# Patient Record
Sex: Male | Born: 1960
Health system: Southern US, Community
[De-identification: ages and names within clinical notes are randomized; demographics above are authoritative.]

## PROBLEM LIST (undated history)

## (undated) ENCOUNTER — Emergency Department (HOSPITAL_COMMUNITY): Admission: EM | Payer: BLUE CROSS/BLUE SHIELD | Source: Home / Self Care

## (undated) DIAGNOSIS — I5031 Acute diastolic (congestive) heart failure: Secondary | ICD-10-CM

## (undated) DIAGNOSIS — Z8489 Family history of other specified conditions: Secondary | ICD-10-CM

## (undated) DIAGNOSIS — I4891 Unspecified atrial fibrillation: Secondary | ICD-10-CM

## (undated) DIAGNOSIS — I10 Essential (primary) hypertension: Secondary | ICD-10-CM

## (undated) DIAGNOSIS — I4819 Other persistent atrial fibrillation: Secondary | ICD-10-CM

## (undated) HISTORY — DX: Other persistent atrial fibrillation: I48.19

## (undated) HISTORY — PX: APPENDECTOMY: SHX54

---

## 2000-07-12 ENCOUNTER — Other Ambulatory Visit: Admission: RE | Admit: 2000-07-12 | Discharge: 2000-07-12 | Payer: Self-pay | Admitting: Urology

## 2016-03-15 DIAGNOSIS — I482 Chronic atrial fibrillation: Secondary | ICD-10-CM | POA: Diagnosis not present

## 2016-03-15 DIAGNOSIS — M79606 Pain in leg, unspecified: Secondary | ICD-10-CM | POA: Diagnosis not present

## 2016-03-15 DIAGNOSIS — I272 Other secondary pulmonary hypertension: Secondary | ICD-10-CM | POA: Diagnosis not present

## 2016-03-15 DIAGNOSIS — R0989 Other specified symptoms and signs involving the circulatory and respiratory systems: Secondary | ICD-10-CM | POA: Diagnosis not present

## 2016-03-15 DIAGNOSIS — R0602 Shortness of breath: Secondary | ICD-10-CM | POA: Diagnosis not present

## 2016-03-15 DIAGNOSIS — I517 Cardiomegaly: Secondary | ICD-10-CM | POA: Diagnosis not present

## 2016-03-20 DIAGNOSIS — R0609 Other forms of dyspnea: Secondary | ICD-10-CM | POA: Diagnosis not present

## 2016-03-20 DIAGNOSIS — I482 Chronic atrial fibrillation: Secondary | ICD-10-CM | POA: Diagnosis not present

## 2016-03-20 DIAGNOSIS — I517 Cardiomegaly: Secondary | ICD-10-CM | POA: Diagnosis not present

## 2016-03-20 DIAGNOSIS — R9431 Abnormal electrocardiogram [ECG] [EKG]: Secondary | ICD-10-CM | POA: Diagnosis not present

## 2016-03-23 DIAGNOSIS — E559 Vitamin D deficiency, unspecified: Secondary | ICD-10-CM | POA: Diagnosis not present

## 2016-03-23 DIAGNOSIS — I482 Chronic atrial fibrillation: Secondary | ICD-10-CM | POA: Diagnosis not present

## 2016-03-29 DIAGNOSIS — I482 Chronic atrial fibrillation: Secondary | ICD-10-CM | POA: Diagnosis not present

## 2016-03-29 DIAGNOSIS — I4891 Unspecified atrial fibrillation: Secondary | ICD-10-CM | POA: Diagnosis not present

## 2016-04-09 DIAGNOSIS — R0602 Shortness of breath: Secondary | ICD-10-CM | POA: Diagnosis not present

## 2016-04-18 DIAGNOSIS — I10 Essential (primary) hypertension: Secondary | ICD-10-CM | POA: Diagnosis not present

## 2016-04-18 DIAGNOSIS — I482 Chronic atrial fibrillation: Secondary | ICD-10-CM | POA: Diagnosis not present

## 2016-05-15 DIAGNOSIS — I5031 Acute diastolic (congestive) heart failure: Secondary | ICD-10-CM

## 2016-05-15 HISTORY — DX: Acute diastolic (congestive) heart failure: I50.31

## 2016-05-23 DIAGNOSIS — R0683 Snoring: Secondary | ICD-10-CM | POA: Diagnosis not present

## 2016-05-23 DIAGNOSIS — I4891 Unspecified atrial fibrillation: Secondary | ICD-10-CM | POA: Diagnosis not present

## 2016-06-13 ENCOUNTER — Inpatient Hospital Stay (HOSPITAL_COMMUNITY)
Admission: EM | Admit: 2016-06-13 | Discharge: 2016-06-18 | DRG: 308 | Disposition: A | Discharge status: Home / Self Care | Payer: BLUE CROSS/BLUE SHIELD | Attending: Cardiology | Admitting: Cardiology

## 2016-06-13 ENCOUNTER — Encounter (HOSPITAL_COMMUNITY): Payer: Self-pay | Admitting: Emergency Medicine

## 2016-06-13 ENCOUNTER — Emergency Department (HOSPITAL_COMMUNITY): Payer: BLUE CROSS/BLUE SHIELD

## 2016-06-13 DIAGNOSIS — I110 Hypertensive heart disease with heart failure: Secondary | ICD-10-CM | POA: Diagnosis present

## 2016-06-13 DIAGNOSIS — I4892 Unspecified atrial flutter: Secondary | ICD-10-CM | POA: Diagnosis not present

## 2016-06-13 DIAGNOSIS — I5021 Acute systolic (congestive) heart failure: Secondary | ICD-10-CM | POA: Diagnosis not present

## 2016-06-13 DIAGNOSIS — Z7901 Long term (current) use of anticoagulants: Secondary | ICD-10-CM | POA: Diagnosis not present

## 2016-06-13 DIAGNOSIS — I10 Essential (primary) hypertension: Secondary | ICD-10-CM | POA: Diagnosis not present

## 2016-06-13 DIAGNOSIS — R000 Tachycardia, unspecified: Secondary | ICD-10-CM | POA: Diagnosis not present

## 2016-06-13 DIAGNOSIS — Z6839 Body mass index (BMI) 39.0-39.9, adult: Secondary | ICD-10-CM | POA: Diagnosis not present

## 2016-06-13 DIAGNOSIS — G4733 Obstructive sleep apnea (adult) (pediatric): Secondary | ICD-10-CM | POA: Diagnosis present

## 2016-06-13 DIAGNOSIS — I481 Persistent atrial fibrillation: Principal | ICD-10-CM | POA: Diagnosis present

## 2016-06-13 DIAGNOSIS — I43 Cardiomyopathy in diseases classified elsewhere: Secondary | ICD-10-CM

## 2016-06-13 DIAGNOSIS — I482 Chronic atrial fibrillation: Secondary | ICD-10-CM | POA: Diagnosis not present

## 2016-06-13 DIAGNOSIS — I5031 Acute diastolic (congestive) heart failure: Secondary | ICD-10-CM | POA: Diagnosis present

## 2016-06-13 DIAGNOSIS — I428 Other cardiomyopathies: Secondary | ICD-10-CM | POA: Diagnosis not present

## 2016-06-13 DIAGNOSIS — Z8249 Family history of ischemic heart disease and other diseases of the circulatory system: Secondary | ICD-10-CM | POA: Diagnosis not present

## 2016-06-13 DIAGNOSIS — I480 Paroxysmal atrial fibrillation: Secondary | ICD-10-CM | POA: Diagnosis not present

## 2016-06-13 DIAGNOSIS — I4891 Unspecified atrial fibrillation: Secondary | ICD-10-CM | POA: Diagnosis not present

## 2016-06-13 DIAGNOSIS — E669 Obesity, unspecified: Secondary | ICD-10-CM | POA: Diagnosis not present

## 2016-06-13 DIAGNOSIS — R0602 Shortness of breath: Secondary | ICD-10-CM

## 2016-06-13 HISTORY — DX: Unspecified atrial fibrillation: I48.91

## 2016-06-13 HISTORY — DX: Essential (primary) hypertension: I10

## 2016-06-13 LAB — BASIC METABOLIC PANEL
ANION GAP: 9 (ref 5–15)
BUN: 14 mg/dL (ref 6–20)
CHLORIDE: 104 mmol/L (ref 101–111)
CO2: 24 mmol/L (ref 22–32)
Calcium: 9.3 mg/dL (ref 8.9–10.3)
Creatinine, Ser: 0.97 mg/dL (ref 0.61–1.24)
GFR calc non Af Amer: >60 mL/min (ref >60)
Glucose, Bld: 119 mg/dL — ABNORMAL HIGH (ref 65–99)
POTASSIUM: 4.2 mmol/L (ref 3.5–5.1)
Sodium: 137 mmol/L (ref 135–145)

## 2016-06-13 LAB — CBC WITH DIFFERENTIAL/PLATELET
BASOS ABS: 0 10*3/uL (ref 0.0–0.1)
BASOS PCT: 0 %
Eosinophils Absolute: 0.1 10*3/uL (ref 0.0–0.7)
Eosinophils Relative: 1 %
HEMATOCRIT: 44.3 % (ref 39.0–52.0)
HEMOGLOBIN: 14.8 g/dL (ref 13.0–17.0)
LYMPHS PCT: 13 %
Lymphs Abs: 1.2 10*3/uL (ref 0.7–4.0)
MCH: 31.6 pg (ref 26.0–34.0)
MCHC: 33.4 g/dL (ref 30.0–36.0)
MCV: 94.5 fL (ref 78.0–100.0)
Monocytes Absolute: 0.7 10*3/uL (ref 0.1–1.0)
Monocytes Relative: 8 %
NEUTROS ABS: 6.8 10*3/uL (ref 1.7–7.7)
NEUTROS PCT: 78 %
Platelets: 175 10*3/uL (ref 150–400)
RBC: 4.69 MIL/uL (ref 4.22–5.81)
RDW: 14.2 % (ref 11.5–15.5)
WBC: 8.7 10*3/uL (ref 4.0–10.5)

## 2016-06-13 LAB — BRAIN NATRIURETIC PEPTIDE: B NATRIURETIC PEPTIDE 5: 484.2 pg/mL — AB (ref 0.0–100.0)

## 2016-06-13 LAB — MAGNESIUM: Magnesium: 2.2 mg/dL (ref 1.7–2.4)

## 2016-06-13 LAB — I-STAT TROPONIN, ED: TROPONIN I, POC: 0.01 ng/mL (ref 0.00–0.08)

## 2016-06-13 LAB — MRSA PCR SCREENING: MRSA BY PCR: NEGATIVE

## 2016-06-13 LAB — TSH: TSH: 0.479 u[IU]/mL (ref 0.350–4.500)

## 2016-06-13 MED ORDER — ACETAMINOPHEN 325 MG PO TABS
650.0000 mg | ORAL_TABLET | ORAL | Status: DC | PRN
  Administered 2016-06-13: 650 mg via ORAL
  Filled 2016-06-13 (×2): qty 2

## 2016-06-13 MED ORDER — DILTIAZEM HCL-DEXTROSE 100-5 MG/100ML-% IV SOLN (PREMIX)
5.0000 mg/h | INTRAVENOUS | Status: DC
  Administered 2016-06-13 – 2016-06-14 (×5): 15 mg/h via INTRAVENOUS
  Filled 2016-06-13 (×5): qty 100

## 2016-06-13 MED ORDER — ONDANSETRON HCL 4 MG/2ML IJ SOLN: 4.0000 mg | Freq: Four times a day (QID) | INTRAMUSCULAR | Status: DC | PRN

## 2016-06-13 MED ORDER — SODIUM CHLORIDE 0.9% FLUSH
3.0000 mL | Freq: Two times a day (BID) | INTRAVENOUS | Status: DC
  Administered 2016-06-14 – 2016-06-18 (×7): 3 mL via INTRAVENOUS

## 2016-06-13 MED ORDER — ZOLPIDEM TARTRATE 5 MG PO TABS
5.0000 mg | ORAL_TABLET | Freq: Every evening | ORAL | Status: DC | PRN
  Administered 2016-06-14 – 2016-06-16 (×3): 5 mg via ORAL
  Filled 2016-06-13 (×3): qty 1

## 2016-06-13 MED ORDER — FUROSEMIDE 10 MG/ML IJ SOLN
20.0000 mg | Freq: Once | INTRAMUSCULAR | Status: AC
  Administered 2016-06-13: 20 mg via INTRAVENOUS
  Filled 2016-06-13: qty 2

## 2016-06-13 MED ORDER — NITROGLYCERIN 0.4 MG SL SUBL: 0.4000 mg | SUBLINGUAL_TABLET | SUBLINGUAL | Status: DC | PRN

## 2016-06-13 MED ORDER — ASPIRIN EC 81 MG PO TBEC: 81.0000 mg | DELAYED_RELEASE_TABLET | Freq: Every day | ORAL | Status: DC

## 2016-06-13 MED ORDER — ALBUTEROL SULFATE (2.5 MG/3ML) 0.083% IN NEBU: 5.0000 mg | INHALATION_SOLUTION | Freq: Once | RESPIRATORY_TRACT | Status: DC

## 2016-06-13 MED ORDER — SODIUM CHLORIDE 0.9 % IV SOLN
250.0000 mL | INTRAVENOUS | Status: DC | PRN
  Administered 2016-06-17: 17:00:00 via INTRAVENOUS

## 2016-06-13 MED ORDER — RIVAROXABAN 20 MG PO TABS
20.0000 mg | ORAL_TABLET | Freq: Every day | ORAL | Status: DC
  Administered 2016-06-13 – 2016-06-17 (×5): 20 mg via ORAL
  Filled 2016-06-13 (×6): qty 1

## 2016-06-13 MED ORDER — DILTIAZEM LOAD VIA INFUSION
20.0000 mg | Freq: Once | INTRAVENOUS | Status: AC
Start: 2016-06-13 — End: 2016-06-13
  Administered 2016-06-13: 20 mg via INTRAVENOUS

## 2016-06-13 MED ORDER — ALPRAZOLAM 0.25 MG PO TABS
0.2500 mg | ORAL_TABLET | Freq: Two times a day (BID) | ORAL | Status: DC | PRN
  Administered 2016-06-14: 0.25 mg via ORAL
  Filled 2016-06-13: qty 1

## 2016-06-13 MED ORDER — METOPROLOL TARTRATE 50 MG PO TABS
50.0000 mg | ORAL_TABLET | Freq: Two times a day (BID) | ORAL | Status: DC
  Administered 2016-06-13 – 2016-06-14 (×3): 50 mg via ORAL
  Filled 2016-06-13: qty 2
  Filled 2016-06-13 (×2): qty 1

## 2016-06-13 MED ORDER — FUROSEMIDE 10 MG/ML IJ SOLN: 40.0000 mg | Freq: Once | INTRAMUSCULAR | Status: DC

## 2016-06-13 MED ORDER — DILTIAZEM LOAD VIA INFUSION
20.0000 mg | Freq: Once | INTRAVENOUS | Status: AC
Start: 2016-06-13 — End: 2016-06-13
  Administered 2016-06-13: 20 mg via INTRAVENOUS
  Filled 2016-06-13: qty 20

## 2016-06-13 MED ORDER — SODIUM CHLORIDE 0.9% FLUSH: 3.0000 mL | INTRAVENOUS | Status: DC | PRN

## 2016-06-13 NOTE — ED Provider Notes (Signed)
[facility]ENCY DEPT Provider Note   CSN: [id number] Arrival date & time: 06/13/16  1257     History   Chief Complaint Chief Complaint  Patient presents with  . Shortness of Breath  . Leg Swelling    HPI Russell Beckmannimothy Bitting is a 55 y.o. male.  55 yo M with a chief complaint of chest pain shortness breath and weakness. This been going on for the past 3 weeks. Worse with exertion and better with rest. Patient was recently diagnosed with atrial fibrillation about 4 months ago. He has been on Xarelto since then.    The history is provided by the patient.  Shortness of Breath  This is a new problem. The average episode lasts 2 weeks. The problem occurs frequently.The current episode started more than 1 week ago. The problem has not changed since onset.Associated symptoms include chest pain. Pertinent negatives include no fever, no headaches, no vomiting, no abdominal pain and no rash. He has tried nothing for the symptoms. The treatment provided no relief.    Past Medical History:  Diagnosis Date  . A-fib (HCC)   . Hypertension     Patient Active Problem List   Diagnosis Date Noted  . Atrial fibrillation with RVR (HCC) 06/13/2016  . Acute diastolic CHF (congestive heart failure) (HCC) 06/13/2016  . Essential hypertension 06/13/2016  . Chronic anticoagulation-Xarelto 06/13/2016  . Hypertensive heart disease with congestive heart failure (HCC) 06/13/2016    Past Surgical History:  Procedure Laterality Date  . APPENDECTOMY         Home Medications    Prior to Admission medications   Medication Sig Start Date End Date Taking? Authorizing Provider  albuterol (PROVENTIL HFA;VENTOLIN HFA) 108 (90 Base) MCG/ACT inhaler Inhale 1 puff into the lungs every 6 (six) hours as needed for wheezing or shortness of breath.   Yes Historical Provider, MD  cholecalciferol (VITAMIN D) 1000 units tablet Take 1,000 Units by mouth daily.   Yes Historical Provider, MD  diltiazem (CARDIZEM) 60 MG  tablet Take 60 mg by mouth 2 (two) times daily.   Yes Historical Provider, MD  lisinopril (PRINIVIL,ZESTRIL) 10 MG tablet Take 10 mg by mouth every evening.   Yes Historical Provider, MD  rivaroxaban (XARELTO) 20 MG TABS tablet Take 20 mg by mouth daily with supper.   Yes Historical Provider, MD    Family History Family History  Problem Relation Age of Onset  . Heart failure Mother     Social History Social History  Substance Use Topics  . Smoking status: Never Smoker  . Smokeless tobacco: Former NeurosurgeonUser    Types: Chew  . Alcohol use No     Allergies   Metoprolol and Amoxicillin   Review of Systems Review of Systems  Constitutional: Negative for chills and fever.  HENT: Negative for congestion and facial swelling.   Eyes: Negative for discharge and visual disturbance.  Respiratory: Positive for shortness of breath.   Cardiovascular: Positive for chest pain. Negative for palpitations.  Gastrointestinal: Negative for abdominal pain, diarrhea and vomiting.  Musculoskeletal: Negative for arthralgias and myalgias.  Skin: Negative for color change and rash.  Neurological: Positive for weakness. Negative for tremors, syncope and headaches.  Psychiatric/Behavioral: Negative for confusion and dysphoric mood.     Physical Exam Updated Vital Signs BP 108/85   Pulse 95   Temp 97.7 F (36.5 C) (Oral)   Resp 19   Wt 291 lb 0.1 oz (132 kg)   SpO2 97%   Physical Exam  Constitutional:  He is oriented to person, place, and time. He appears well-developed and well-nourished.  HENT:  Head: Normocephalic and atraumatic.  Eyes: Conjunctivae and EOM are normal. Pupils are equal, round, and reactive to light.  Neck: Normal range of motion. No JVD present.  Cardiovascular: Normal rate.  An irregularly irregular rhythm present.  Pulmonary/Chest: Effort normal. No stridor. No respiratory distress.  Abdominal: He exhibits no distension. There is no tenderness. There is no guarding.    Musculoskeletal: Normal range of motion. He exhibits no edema.  Neurological: He is alert and oriented to person, place, and time.  Skin: Skin is warm and dry.  Psychiatric: He has a normal mood and affect. His behavior is normal.     ED Treatments / Results  Labs (all labs ordered are listed, but only abnormal results are displayed) Labs Reviewed  BASIC METABOLIC PANEL - Abnormal; Notable for the following:       Result Value   Glucose, Bld 119 (*)    All other components within normal limits  BRAIN NATRIURETIC PEPTIDE - Abnormal; Notable for the following:    B Natriuretic Peptide 484.2 (*)    All other components within normal limits  BASIC METABOLIC PANEL - Abnormal; Notable for the following:    Glucose, Bld 103 (*)    All other components within normal limits  MRSA PCR SCREENING  CBC WITH DIFFERENTIAL/PLATELET  TSH  MAGNESIUM  LIPID PANEL  I-STAT TROPOININ, ED    EKG  EKG Interpretation  Date/Time:  Wednesday June 13 2016 13:12:37 EDT Ventricular Rate:  170 PR Interval:    QRS Duration: 82 QT Interval:  276 QTC Calculation: 464 R Axis:   98 Text Interpretation:  Atrial fibrillation with rapid ventricular response Rightward axis Nonspecific T wave abnormality Abnormal ECG No old tracing to compare Confirmed by Sylvia Kondracki MD, DANIEL [phone]) on 06/13/2016 1:28:35 PM       Radiology Dg Chest Port 1 View  Result Date: 06/13/2016 CLINICAL DATA:  Tachycardia. Atrial fibrillation. Shortness of breath. EXAM: PORTABLE CHEST 1 VIEW COMPARISON:  None. FINDINGS: The heart size and mediastinal contours are within normal limits. Both lungs are clear. The visualized skeletal structures are unremarkable. IMPRESSION: Normal chest. Electronically Signed   By: Francene Boyers M.D.   On: 06/13/2016 13:58    Procedures Procedures (including critical care time)  Medications Ordered in ED Medications  diltiazem (CARDIZEM) 1 mg/mL load via infusion 20 mg (20 mg Intravenous Bolus from  Bag 06/13/16 1331)    And  diltiazem (CARDIZEM) 100 mg in dextrose 5% (1 mg/mL) infusion (15 mg/hr Intravenous New Bag/Given 06/14/16 0800)  acetaminophen (TYLENOL) tablet 650 mg (650 mg Oral Given 06/13/16 1903)  ondansetron (ZOFRAN) injection 4 mg (not administered)  sodium chloride flush (NS) 0.9 % injection 3 mL (3 mLs Intravenous Given 06/14/16 1000)  sodium chloride flush (NS) 0.9 % injection 3 mL (not administered)  0.9 %  sodium chloride infusion (not administered)  ALPRAZolam (XANAX) tablet 0.25 mg (0.25 mg Oral Given 06/14/16 0107)  zolpidem (AMBIEN) tablet 5 mg (not administered)  rivaroxaban (XARELTO) tablet 20 mg (20 mg Oral Given 06/13/16 1828)  metoprolol tartrate (LOPRESSOR) tablet 50 mg (50 mg Oral Given 06/14/16 0830)  furosemide (LASIX) injection 40 mg (40 mg Intravenous Given 06/14/16 0830)  diltiazem (CARDIZEM) 1 mg/mL load via infusion 20 mg (20 mg Intravenous Bolus from Bag 06/13/16 1553)  furosemide (LASIX) injection 20 mg (20 mg Intravenous Given 06/13/16 1619)  furosemide (LASIX) injection 20 mg (20  mg Intravenous Given 06/13/16 1639)     Initial Impression / Assessment and Plan / ED Course  I have reviewed the triage vital signs and the nursing notes.  Pertinent labs & imaging results that were available during my care of the patient were reviewed by me and considered in my medical decision making (see chart for details).  Clinical Course    55 yo M with chest pain, sob, palpitations.  Afib w RVR on arrival.  Allergy to metoprolol.  Started on dilt drip without improvement.  Some concern for high output heart failure.  Cards admit.   CRITICAL CARE Performed by: Rae Roam   Total critical care time: 35 minutes  Critical care time was exclusive of separately billable procedures and treating other patients.  Critical care was necessary to treat or prevent imminent or life-threatening deterioration.  Critical care was time spent personally by me on  the following activities: development of treatment plan with patient and/or surrogate as well as nursing, discussions with consultants, evaluation of patient's response to treatment, examination of patient, obtaining history from patient or surrogate, ordering and performing treatments and interventions, ordering and review of laboratory studies, ordering and review of radiographic studies, pulse oximetry and re-evaluation of patient's condition.   Final Clinical Impressions(s) / ED Diagnoses   Final diagnoses:  Atrial fibrillation with rapid ventricular response [facility])    New Prescriptions Current Discharge Medication List       Melene Plan, DO 06/14/16 1610

## 2016-06-13 NOTE — H&P (Signed)
Patient ID: Russell Arnold MRN: [id number], DOB/AGE: 04/14/1961   Admit date: 06/13/2016   Primary Physician: No primary care provider on file. Primary Cardiologist: Dr [facility]  HPI: Russell Arnold, 2 children, works as a Company secretary. Russell Arnold noted some SOB and cough about 4 months ago and went to his PCP. Russell Arnold was placed on ABs and improved but it was decided Russell Arnold was due for a complete H&P. This apparently revealed atrial fibrillation that Russell Arnold was unaware of.  Russell Arnold was referred to Dr Hanley Hays. Russell Arnold says Russell Arnold had carotid and LE dopplers, an echo, and a Myoview. Above all normal except for "thickened heart from HTN". Russell Arnold was placed on Metoprolol and Xarelto. Russell Arnold had a rash on Metoprolol and this was changed to Diltiazem. Russell Arnold did fairly well after this. Dr Hanley Hays mentioned a cardioversion but this was never set up.           Two weeks ago Russell Arnold went to [place] for vacation. Towards Russell end of this trip Russell Arnold noted increasing LE edema. Russell Arnold returned yesterday and went to his PCP at [facility] today and they sent him to Russell ED at [facility] with rapid AF (120-170). Russell Arnold is unaware of tachycardia. Russell Arnold did say Russell Arnold woke up twice last night and has been SOB. His main concern was his LE edema. In Russell ED Russell Arnold was loaded with Diltiazem and placed on an IV Diltiazem drip. His HR was 150-170 when I saw him and Russell Arnold was tolerating this well.   Problem List: Past Medical History:  Diagnosis Date  . A-fib (HCC)   . Hypertension     Past Surgical History:  Procedure Laterality Date  . APPENDECTOMY       Allergies: Metoprolol- hives  Home Medications Prior to Admission medications   Not on File   Xarelto Diltiazem 60 mg BID Lisinopril 10 mg daily  Family History  Problem Relation Age of Onset  . Heart failure Mother      Social History   Social History  . Marital status: Married    Spouse name: N/A  . Number of children: N/A  . Years of education: N/A   Occupational  History  . Company secretary    Social History Main Topics  . Smoking status: Never Smoker  . Smokeless tobacco: Former Neurosurgeon    Types: Chew  . Alcohol use No  . Drug use: No  . Sexual activity: Not on file   Other Topics Concern  . Not on file   Social History Narrative  . No narrative on file     Review of Systems: General: negative for chills, fever, night sweats or weight changes.  Cardiovascular: negative for chest pain, palpitations, HEENT: negative for any visual disturbances, blindness, glaucoma Dermatological: negative for rash Respiratory: negative for wheezing Urologic: negative for hematuria or dysuria Abdominal: negative for nausea, vomiting, diarrhea, bright red blood per rectum, melena, or hematemesis Neurologic: negative for visual changes, syncope, or dizziness Musculoskeletal: negative for back pain, joint pain, or swelling Psych: cooperative and appropriate All other systems reviewed and are otherwise negative except as noted above.  Physical Exam: Blood pressure (!) 128/108, pulse (!) 48, temperature 98 F (36.7 C), temperature source Oral, resp. rate 18, SpO2 (!) 89 %.  General appearance: alert, cooperative, no distress and moderately obese Neck: no carotid bruit and no JVD Lungs: decreased breath sounds Heart: irregularly irregular rhythm Abdomen: soft, non-tender; bowel sounds normal;  no masses,  no organomegaly Extremities: 2+ pitting edema Pulses: 2+ and symmetric Skin: Skin color, texture, turgor normal. No rashes or lesions Neurologic: Grossly normal    Labs:   Results for orders placed or performed during Russell hospital encounter of 06/13/16 (from Russell past 24 hour(s))  CBC with Differential     Status: None   Collection Time: 06/13/16  1:20 PM  Result Value Ref Range   WBC 8.7 4.0 - 10.5 K/uL   RBC 4.69 4.22 - 5.81 MIL/uL   Hemoglobin 14.8 13.0 - 17.0 g/dL   HCT 84.6 96.2 - 95.2 %   MCV 94.5 78.0 - 100.0 fL   MCH 31.6 26.0 - 34.0 pg    MCHC 33.4 30.0 - 36.0 g/dL   RDW 84.1 32.4 - 40.1 %   Platelets 175 150 - 400 K/uL   Neutrophils Relative % 78 %   Neutro Abs 6.8 1.7 - 7.7 K/uL   Lymphocytes Relative 13 %   Lymphs Abs 1.2 0.7 - 4.0 K/uL   Monocytes Relative 8 %   Monocytes Absolute 0.7 0.1 - 1.0 K/uL   Eosinophils Relative 1 %   Eosinophils Absolute 0.1 0.0 - 0.7 K/uL   Basophils Relative 0 %   Basophils Absolute 0.0 0.0 - 0.1 K/uL  Basic metabolic panel     Status: Abnormal   Collection Time: 06/13/16  1:20 PM  Result Value Ref Range   Sodium 137 135 - 145 mmol/L   Potassium 4.2 3.5 - 5.1 mmol/L   Chloride 104 101 - 111 mmol/L   CO2 24 22 - 32 mmol/L   Glucose, Bld 119 (H) 65 - 99 mg/dL   BUN 14 6 - 20 mg/dL   Creatinine, Ser 0.27 0.61 - 1.24 mg/dL   Calcium 9.3 8.9 - 25.3 mg/dL   GFR calc non Af Amer >60 >60 mL/min   GFR calc Af Amer >60 >60 mL/min   Anion gap 9 5 - 15  Brain natriuretic peptide     Status: Abnormal   Collection Time: 06/13/16  1:20 PM  Result Value Ref Range   B Natriuretic Peptide 484.2 (H) 0.0 - 100.0 pg/mL  I-stat troponin, ED     Status: None   Collection Time: 06/13/16  2:10 PM  Result Value Ref Range   Troponin i, poc 0.01 0.00 - 0.08 ng/mL   Comment 3             Radiology/Studies: Dg Chest Port 1 View  Result Date: 06/13/2016 CLINICAL DATA:  Tachycardia. Atrial fibrillation. Shortness of breath. EXAM: PORTABLE CHEST 1 VIEW COMPARISON:  None. FINDINGS: Russell heart size and mediastinal contours are within normal limits. Both lungs are clear. Russell visualized skeletal structures are unremarkable. IMPRESSION: Normal chest. Electronically Signed   By: Francene Boyers M.D.   On: 06/13/2016 13:58    EKG:AF with RVR  ASSESSMENT AND PLAN:  Principal Problem:   Atrial fibrillation with RVR (HCC) Active Problems:   Acute congestive heart failure (HCC)   Essential hypertension   Chronic anticoagulation-Xarelto   PLAN: Despite BNP of only 482 and clear CXR Russell Arnold has some CHF on exam,  will give one dose of IV Lasix. Check Mg++, check TSH, give another bolus of Diltiazem. Stop Lisinopril (chronic throat tightness). Continue Xarelto. Russell Arnold says Russell Arnold is already scheduled to have a sleep study. Will request records from [facility].   Signed, Corine Shelter, PA-C 06/13/2016, 4:20 PM [phone]  Patient seen and examined and history reviewed. Agree  with above findings and plan. Russell 55 yo Arnold with history of HTN, obesity presents with symptoms of worsening SOB, orthopnea, and PND for last 2 weeks. Known history of Afib for at least 5 months. [facility] with [facility] normal stress test. Echo showed LVH with apparently normal LV function per patient results. Russell Arnold was tried on metoprolol but states Russell Arnold had a rash on his legs and it was stopped in favor of diltiazem. Russell Arnold could not take a higher dose of 120 mg diltiazem because Russell Arnold felt his throat closing up. Russell Arnold has been on uninterrupted Xarelto. Russell Arnold does have a history of disordered sleep and snoring. Is planning to have sleep study done  On exam Russell Arnold is an obese Arnold in NAD Initial HR 170- now 120 on cardizem drip at 15 mg/hr.  Positive JVD Lungs with diminished BS in bases.  CV IRR no gallop or murmur 2+ pitting edema.  Labs, Ecg, and CXR reviewed.   Impression:   Acute diastolic CHF secondary to hypertensive heart disease and Afib Afib with RVR Obesity possible sleep apnea.  Plan: admit for IV diruesis. Continue IV diltiazem for rate control. Will rechallenge with metoprolol- I am not convinced Russell Arnold had an allergic reaction to this. Will plan on DCCV tomorrow. If afib recurs will need to consider AAD therapy versus ablation. Will update Echo. Agree with sleep study as outpatient.  Stratton Villwock [place], MDFACC 06/13/2016 4:56 PM

## 2016-06-13 NOTE — ED Triage Notes (Signed)
Pt from PCP office with c/o a-fib RVR with hx of a-fib.  Pt reports he's had worsening SOB with leg swelling x 3 weeks.  Pt in A&O.

## 2016-06-14 ENCOUNTER — Encounter (HOSPITAL_COMMUNITY): Payer: Self-pay | Admitting: *Deleted

## 2016-06-14 ENCOUNTER — Encounter (HOSPITAL_COMMUNITY)
Admission: EM | Disposition: A | Discharge status: Home / Self Care | Payer: Self-pay | Source: Home / Self Care | Attending: Cardiology

## 2016-06-14 ENCOUNTER — Inpatient Hospital Stay (HOSPITAL_COMMUNITY): Payer: BLUE CROSS/BLUE SHIELD

## 2016-06-14 ENCOUNTER — Inpatient Hospital Stay (HOSPITAL_COMMUNITY): Payer: BLUE CROSS/BLUE SHIELD | Admitting: Anesthesiology

## 2016-06-14 DIAGNOSIS — Z7901 Long term (current) use of anticoagulants: Secondary | ICD-10-CM

## 2016-06-14 HISTORY — PX: CARDIOVERSION: SHX1299

## 2016-06-14 LAB — BASIC METABOLIC PANEL
Anion gap: 8 (ref 5–15)
BUN: 15 mg/dL (ref 6–20)
CO2: 30 mmol/L (ref 22–32)
Calcium: 9.3 mg/dL (ref 8.9–10.3)
Chloride: 101 mmol/L (ref 101–111)
Creatinine, Ser: 1.14 mg/dL (ref 0.61–1.24)
GFR calc Af Amer: >60 mL/min (ref >60)
GFR calc non Af Amer: >60 mL/min (ref >60)
Glucose, Bld: 103 mg/dL — ABNORMAL HIGH (ref 65–99)
Potassium: 4.6 mmol/L (ref 3.5–5.1)
Sodium: 139 mmol/L (ref 135–145)

## 2016-06-14 LAB — LIPID PANEL
Cholesterol: 124 mg/dL (ref 0–200)
HDL: 54 mg/dL (ref >40)
LDL Cholesterol: 60 mg/dL (ref 0–99)
Total CHOL/HDL Ratio: 2.3 RATIO
Triglycerides: 48 mg/dL (ref <150)
VLDL: 10 mg/dL (ref 0–40)

## 2016-06-14 SURGERY — CARDIOVERSION
Anesthesia: Monitor Anesthesia Care

## 2016-06-14 MED ORDER — LIDOCAINE HCL (CARDIAC) 20 MG/ML IV SOLN
INTRAVENOUS | Status: DC | PRN
  Administered 2016-06-14: 20 mg via INTRATRACHEAL

## 2016-06-14 MED ORDER — SODIUM CHLORIDE 0.9% FLUSH
3.0000 mL | Freq: Two times a day (BID) | INTRAVENOUS | Status: DC
  Administered 2016-06-17: 3 mL via INTRAVENOUS

## 2016-06-14 MED ORDER — FUROSEMIDE 10 MG/ML IJ SOLN
40.0000 mg | Freq: Two times a day (BID) | INTRAMUSCULAR | Status: DC
  Administered 2016-06-14 – 2016-06-15 (×3): 40 mg via INTRAVENOUS
  Filled 2016-06-14 (×3): qty 4

## 2016-06-14 MED ORDER — SODIUM CHLORIDE 0.9% FLUSH: 3.0000 mL | INTRAVENOUS | Status: DC | PRN

## 2016-06-14 MED ORDER — SODIUM CHLORIDE 0.9 % IV SOLN: 250.0000 mL | INTRAVENOUS | Status: DC

## 2016-06-14 MED ORDER — PROPOFOL 10 MG/ML IV BOLUS
INTRAVENOUS | Status: DC | PRN
  Administered 2016-06-14: 70 mg via INTRAVENOUS

## 2016-06-14 MED ORDER — SODIUM CHLORIDE 0.9 % IV SOLN
INTRAVENOUS | Status: DC | PRN
  Administered 2016-06-14: 10:00:00 via INTRAVENOUS

## 2016-06-14 NOTE — Addendum Note (Signed)
Addendum  created 06/14/16 1420 by Elisabeth Most, CRNA   Anesthesia Event edited, Anesthesia Intra Flowsheets edited

## 2016-06-14 NOTE — H&P (View-Only) (Signed)
 TELEMETRY: Reviewed telemetry pt in Atrial fibdrillation. Rate 110-120: Vitals:   06/14/16 0100 06/14/16 0500 06/14/16 0600 06/14/16 0700  BP: (!) 124/93 (!) 112/95 112/83 121/76  Pulse:      Resp: 19 16 13 16  Temp:      TempSrc:      SpO2:    97%  Weight:    291 lb 0.1 oz (132 kg)    Intake/Output Summary (Last 24 hours) at 06/14/16 0824 Last data filed at 06/14/16 0800  Gross per 24 hour  Intake              165 ml  Output             3100 ml  Net            -2935 ml   Filed Weights   06/14/16 0700  Weight: 291 lb 0.1 oz (132 kg)    Subjective  States breathing is better and legs are less tight. No chest pain. Good diuresis.  . furosemide  40 mg Intravenous Q12H  . metoprolol tartrate  50 mg Oral BID  . rivaroxaban  20 mg Oral Q supper  . sodium chloride flush  3 mL Intravenous Q12H   . diltiazem (CARDIZEM) infusion 15 mg/hr (06/14/16 0238)    LABS: Basic Metabolic Panel:  Recent Labs  06/13/16 1320 06/13/16 1716 06/14/16 0305  NA 137  --  139  K 4.2  --  4.6  CL 104  --  101  CO2 24  --  30  GLUCOSE 119*  --  103*  BUN 14  --  15  CREATININE 0.97  --  1.14  CALCIUM 9.3  --  9.3  MG  --  2.2  --    Liver Function Tests: No results for input(s): AST, ALT, ALKPHOS, BILITOT, PROT, ALBUMIN in the last 72 hours. No results for input(s): LIPASE, AMYLASE in the last 72 hours. CBC:  Recent Labs  06/13/16 1320  WBC 8.7  NEUTROABS 6.8  HGB 14.8  HCT 44.3  MCV 94.5  PLT 175   Cardiac Enzymes: No results for input(s): CKTOTAL, CKMB, CKMBINDEX, TROPONINI in the last 72 hours. BNP: No results for input(s): PROBNP in the last 72 hours. D-Dimer: No results for input(s): DDIMER in the last 72 hours. Hemoglobin A1C: No results for input(s): HGBA1C in the last 72 hours. Fasting Lipid Panel:  Recent Labs  06/14/16 0305  CHOL 124  HDL 54  LDLCALC 60  TRIG 48  CHOLHDL 2.3   Thyroid Function Tests:  Recent Labs  06/13/16 1526  TSH 0.479      Radiology/Studies:  Dg Chest Port 1 View  Result Date: 06/13/2016 CLINICAL DATA:  Tachycardia. Atrial fibrillation. Shortness of breath. EXAM: PORTABLE CHEST 1 VIEW COMPARISON:  None. FINDINGS: The heart size and mediastinal contours are within normal limits. Both lungs are clear. The visualized skeletal structures are unremarkable. IMPRESSION: Normal chest. Electronically Signed   By: James  Maxwell M.D.   On: 06/13/2016 13:58    PHYSICAL EXAM General: Well developed, well nourished, in no acute distress. Head: Normocephalic, atraumatic, sclera non-icteric, oropharynx is clear Neck: Negative for carotid bruits. JVD not elevated. No adenopathy Lungs: Clear bilaterally to auscultation without wheezes, rales, or rhonchi. Breathing is unlabored. Heart: RRR S1 S2 without murmurs, rubs, or gallops.  Abdomen: Soft, non-tender, non-distended with normoactive bowel sounds. No hepatomegaly. No rebound/guarding. No obvious abdominal masses. Msk:  Strength and tone appears normal for age. Extremities:   1-2+ edema.  Distal pedal pulses are 2+ and equal bilaterally. Neuro: Alert and oriented X 3. Moves all extremities spontaneously. Psych:  Responds to questions appropriately with a normal affect.  ASSESSMENT AND PLAN: 1. Persistent atrial fibrillation with RVR. Rate control improved on IV cardizem and po metoprolol. Plan DCCV today. Continue Xarelto for anticoagulation. 2. Acute diastolic CHF. Good initial response to IV lasix. Will continue IV lasix today. Check Echo today.  3. HTN controlled 4. Obesity. Possible OSA. Plan sleep study as outpatient.   Still trying to get records from [facility] concerning prior cardiac work up.  Present on Admission: . Atrial fibrillation with RVR (HCC) . Acute diastolic CHF (congestive heart failure) (HCC) . Essential hypertension . Hypertensive heart disease with congestive heart failure (HCC)   Signed, Peter [place], MDFACC 06/14/2016 8:24  AM

## 2016-06-14 NOTE — Interval H&P Note (Signed)
History and Physical Interval Note:  06/14/2016 10:34 AM  Russell Arnold  has presented today for surgery, with the diagnosis of a fib  The various methods of treatment have been discussed with the patient and family. After consideration of risks, benefits and other options for treatment, the patient has consented to  Procedure(s): CARDIOVERSION (N/A) as a surgical intervention .  The patient's history has been reviewed, patient examined, no change in status, stable for surgery.  I have reviewed the patient's chart and labs.  Questions were answered to the patient's satisfaction.     Theron Arista [facility] 06/14/2016 10:35 AM

## 2016-06-14 NOTE — Transfer of Care (Signed)
Immediate Anesthesia Transfer of Care Note  Patient: Russell Arnold  Procedure(s) Performed: Procedure(s): CARDIOVERSION (N/A)  Patient Location: PACU  Anesthesia Type:General  Level of Consciousness: awake, oriented, sedated, patient cooperative and responds to stimulation  Airway & Oxygen Therapy: Patient Spontanous Breathing and Patient connected to nasal cannula oxygen  Post-op Assessment: Report given to RN, Post -op Vital signs reviewed and stable, Patient moving all extremities and Patient moving all extremities X 4  Post vital signs: Reviewed and stable  Last Vitals:  Vitals:   06/14/16 0900 06/14/16 0926  BP: 108/85 135/82  Pulse:  (!) 103  Resp: 19 18  Temp:      Last Pain:  Vitals:   06/14/16 0800  TempSrc: Oral  PainSc:       Patients Stated Pain Goal: 2 (06/14/16 0700)  Complications: No apparent anesthesia complications

## 2016-06-14 NOTE — Anesthesia Postprocedure Evaluation (Signed)
Anesthesia Post Note  Patient: Russell Arnold  Procedure(s) Performed: Procedure(s) (LRB): CARDIOVERSION (N/A)  Patient location during evaluation: Endoscopy Anesthesia Type: General Level of consciousness: awake and alert, awake and oriented Pain management: pain level controlled Vital Signs Assessment: post-procedure vital signs reviewed and stable Respiratory status: spontaneous breathing, nonlabored ventilation and respiratory function stable Cardiovascular status: blood pressure returned to baseline Anesthetic complications: no    Last Vitals:  Vitals:   06/14/16 1110 06/14/16 1130  BP: 118/83 113/79  Pulse: 72 91  Resp: 16 13  Temp:  36.5 C    Last Pain:  Vitals:   06/14/16 1130  TempSrc: Oral  PainSc:                  Eilan Mcinerny COKER

## 2016-06-14 NOTE — Progress Notes (Signed)
TELEMETRY: Reviewed telemetry pt in Atrial fibdrillation. Rate 110-120: Vitals:   06/14/16 0100 06/14/16 0500 06/14/16 0600 06/14/16 0700  BP: (!) 124/93 (!) 112/95 112/83 121/76  Pulse:      Resp: 19 16 13 16   Temp:      TempSrc:      SpO2:    97%  Weight:    291 lb 0.1 oz (132 kg)    Intake/Output Summary (Last 24 hours) at 06/14/16 0824 Last data filed at 06/14/16 0800  Gross per 24 hour  Intake              165 ml  Output             3100 ml  Net            -2935 ml   Filed Weights   06/14/16 0700  Weight: 291 lb 0.1 oz (132 kg)    Subjective  States breathing is better and legs are less tight. No chest pain. Good diuresis.  . furosemide  40 mg Intravenous Q12H  . metoprolol tartrate  50 mg Oral BID  . rivaroxaban  20 mg Oral Q supper  . sodium chloride flush  3 mL Intravenous Q12H   . diltiazem (CARDIZEM) infusion 15 mg/hr (06/14/16 0238)    LABS: Basic Metabolic Panel:  Recent Labs  16/07/9607/30/17 1320 06/13/16 1716 06/14/16 0305  NA 137  --  139  K 4.2  --  4.6  CL 104  --  101  CO2 24  --  30  GLUCOSE 119*  --  103*  BUN 14  --  15  CREATININE 0.97  --  1.14  CALCIUM 9.3  --  9.3  MG  --  2.2  --    Liver Function Tests: No results for input(s): AST, ALT, ALKPHOS, BILITOT, PROT, ALBUMIN in the last 72 hours. No results for input(s): LIPASE, AMYLASE in the last 72 hours. CBC:  Recent Labs  06/13/16 1320  WBC 8.7  NEUTROABS 6.8  HGB 14.8  HCT 44.3  MCV 94.5  PLT 175   Cardiac Enzymes: No results for input(s): CKTOTAL, CKMB, CKMBINDEX, TROPONINI in the last 72 hours. BNP: No results for input(s): PROBNP in the last 72 hours. D-Dimer: No results for input(s): DDIMER in the last 72 hours. Hemoglobin A1C: No results for input(s): HGBA1C in the last 72 hours. Fasting Lipid Panel:  Recent Labs  06/14/16 0305  CHOL 124  HDL 54  LDLCALC 60  TRIG 48  CHOLHDL 2.3   Thyroid Function Tests:  Recent Labs  06/13/16 1526  TSH 0.479      Radiology/Studies:  Dg Chest Port 1 View  Result Date: 06/13/2016 CLINICAL DATA:  Tachycardia. Atrial fibrillation. Shortness of breath. EXAM: PORTABLE CHEST 1 VIEW COMPARISON:  None. FINDINGS: The heart size and mediastinal contours are within normal limits. Both lungs are clear. The visualized skeletal structures are unremarkable. IMPRESSION: Normal chest. Electronically Signed   By: Francene BoyersJames  Maxwell M.D.   On: 06/13/2016 13:58    PHYSICAL EXAM General: Well developed, well nourished, in no acute distress. Head: Normocephalic, atraumatic, sclera non-icteric, oropharynx is clear Neck: Negative for carotid bruits. JVD not elevated. No adenopathy Lungs: Clear bilaterally to auscultation without wheezes, rales, or rhonchi. Breathing is unlabored. Heart: RRR S1 S2 without murmurs, rubs, or gallops.  Abdomen: Soft, non-tender, non-distended with normoactive bowel sounds. No hepatomegaly. No rebound/guarding. No obvious abdominal masses. Msk:  Strength and tone appears normal for age. Extremities:  1-2+ edema.  Distal pedal pulses are 2+ and equal bilaterally. Neuro: Alert and oriented X 3. Moves all extremities spontaneously. Psych:  Responds to questions appropriately with a normal affect.  ASSESSMENT AND PLAN: 1. Persistent atrial fibrillation with RVR. Rate control improved on IV cardizem and po metoprolol. Plan DCCV today. Continue Xarelto for anticoagulation. 2. Acute diastolic CHF. Good initial response to IV lasix. Will continue IV lasix today. Check Echo today.  3. HTN controlled 4. Obesity. Possible OSA. Plan sleep study as outpatient.   Still trying to get records from [facility] concerning prior cardiac work up.  Present on Admission: . Atrial fibrillation with RVR (HCC) . Acute diastolic CHF (congestive heart failure) (HCC) . Essential hypertension . Hypertensive heart disease with congestive heart failure (HCC)   Signed, Aldo Sondgeroth [place], MDFACC 06/14/2016 8:24  AM

## 2016-06-14 NOTE — Anesthesia Preprocedure Evaluation (Addendum)
Anesthesia Evaluation  Patient identified by MRN, date of birth, ID band Patient awake    Reviewed: Allergy & Precautions, NPO status , Patient's Chart, lab work & pertinent test results  Airway Mallampati: II  TM Distance: >3 FB Neck ROM: Full    Dental  (+) Teeth Intact, Dental Advisory Given   Pulmonary    breath sounds clear to auscultation       Cardiovascular hypertension,  Rhythm:Regular Rate:Normal     Neuro/Psych    GI/Hepatic   Endo/Other    Renal/GU      Musculoskeletal   Abdominal (+) + obese,   Peds  Hematology   Anesthesia Other Findings   Reproductive/Obstetrics                             Anesthesia Physical Anesthesia Plan  ASA: III  Anesthesia Plan: General   Post-op Pain Management:    Induction: Intravenous  Airway Management Planned: Mask  Additional Equipment:   Intra-op Plan:   Post-operative Plan:   Informed Consent: I have reviewed the patients History and Physical, chart, labs and discussed the procedure including the risks, benefits and alternatives for the proposed anesthesia with the patient or authorized representative who has indicated his/her understanding and acceptance.     Plan Discussed with: CRNA and Anesthesiologist  Anesthesia Plan Comments:         Anesthesia Quick Evaluation

## 2016-06-14 NOTE — Progress Notes (Signed)
  Echocardiogram 2D Echocardiogram has been performed.  Leta Jungling M 06/14/2016, 2:46 PM

## 2016-06-14 NOTE — Procedures (Signed)
    Electrical Cardioversion Procedure Note Russell Arnold [id number] Oct 11, 1961  Procedure: Electrical Cardioversion Indications:  Atrial Fibrillation  Procedure Details Consent: Risks of procedure as well as the alternatives and risks of each were explained to the (patient/caregiver).  Consent for procedure obtained. Time Out: Verified patient identification, verified procedure, site/side was marked, verified correct patient position, special equipment/implants available, medications/allergies/relevent history reviewed, required imaging and test results available.  Performed  Patient placed on cardiac monitor, pulse oximetry, supplemental oxygen as necessary.  Sedation given: Short-acting barbiturates Pacer pads placed anterior and posterior chest.  Cardioverted 2 time(s).  Cardioverted at 150J.  Evaluation Findings: Post procedure EKG shows: NSR Complications: None Patient did tolerate procedure well.   Peter [place], MDFACC 06/14/2016, 10:52 AM

## 2016-06-15 LAB — ECHOCARDIOGRAM COMPLETE: Weight: 4656.12 oz

## 2016-06-15 MED ORDER — AMIODARONE HCL IN DEXTROSE 360-4.14 MG/200ML-% IV SOLN
60.0000 mg/h | INTRAVENOUS | Status: AC
  Administered 2016-06-15 (×3): 60 mg/h via INTRAVENOUS
  Filled 2016-06-15 (×3): qty 200

## 2016-06-15 MED ORDER — DEXTROSE 5 % IV SOLN
5.0000 mg/h | INTRAVENOUS | Status: DC
  Filled 2016-06-15: qty 100

## 2016-06-15 MED ORDER — METOPROLOL TARTRATE 50 MG PO TABS
100.0000 mg | ORAL_TABLET | Freq: Two times a day (BID) | ORAL | Status: DC
  Administered 2016-06-15 – 2016-06-16 (×3): 100 mg via ORAL
  Filled 2016-06-15 (×3): qty 2

## 2016-06-15 MED ORDER — DEXTROSE 5 % IV SOLN: 5.0000 mg/h | INTRAVENOUS | Status: DC

## 2016-06-15 MED ORDER — FUROSEMIDE 40 MG PO TABS
40.0000 mg | ORAL_TABLET | Freq: Every day | ORAL | Status: DC
  Administered 2016-06-15 – 2016-06-16 (×2): 40 mg via ORAL
  Filled 2016-06-15 (×2): qty 1

## 2016-06-15 MED ORDER — DILTIAZEM LOAD VIA INFUSION
5.0000 mg | Freq: Once | INTRAVENOUS | Status: DC
  Filled 2016-06-15: qty 15

## 2016-06-15 MED ORDER — AMIODARONE HCL IN DEXTROSE 360-4.14 MG/200ML-% IV SOLN
30.0000 mg/h | INTRAVENOUS | Status: DC
  Administered 2016-06-15 – 2016-06-18 (×6): 30 mg/h via INTRAVENOUS
  Filled 2016-06-15 (×6): qty 200

## 2016-06-15 MED ORDER — DILTIAZEM HCL-DEXTROSE 100-5 MG/100ML-% IV SOLN (PREMIX)
5.0000 mg/h | INTRAVENOUS | Status: DC
Start: 2016-06-15 — End: 2016-06-16
  Administered 2016-06-15 (×3): 15 mg/h via INTRAVENOUS
  Administered 2016-06-15: 5 mg/h via INTRAVENOUS
  Administered 2016-06-16 (×2): 15 mg/h via INTRAVENOUS
  Filled 2016-06-15 (×6): qty 100

## 2016-06-15 MED ORDER — AMIODARONE LOAD VIA INFUSION
150.0000 mg | Freq: Once | INTRAVENOUS | Status: AC
  Administered 2016-06-15: 150 mg via INTRAVENOUS
  Filled 2016-06-15: qty 83.34

## 2016-06-15 NOTE — Progress Notes (Signed)
TELEMETRY: Reviewed telemetry pt in atrial fibrillation with RVR. Went back into afib at 4 am.: Vitals:   06/15/16 0325 06/15/16 0500 06/15/16 0512 06/15/16 0800  BP: 104/87  (!) 137/95 (!) 134/99  Pulse:    (!) 148  Resp:      Temp: 98.3 F (36.8 C)   98 F (36.7 C)  TempSrc: Oral   Oral  SpO2: 97%   97%  Weight:  275 lb 6.4 oz (124.9 kg)    Height:        Intake/Output Summary (Last 24 hours) at 06/15/16 0847 Last data filed at 06/15/16 0800  Gross per 24 hour  Intake            869.5 ml  Output             5625 ml  Net          -4755.5 ml   Filed Weights   06/14/16 0700 06/15/16 0500  Weight: 291 lb 0.1 oz (132 kg) 275 lb 6.4 oz (124.9 kg)    Subjective  Feels well. Really not aware of arrhythmia. Breathing improved.  . furosemide  40 mg Intravenous Q12H  . metoprolol tartrate  50 mg Oral BID  . rivaroxaban  20 mg Oral Q supper  . sodium chloride flush  3 mL Intravenous Q12H  . sodium chloride flush  3 mL Intravenous Q12H   . sodium chloride    . dilTIAZem HCl-Dextrose 15 mg/hr (06/15/16 [id number])    LABS: Basic Metabolic Panel:  Recent Labs  95/28/4108/30/17 1320 06/13/16 1716 06/14/16 0305  NA 137  --  139  K 4.2  --  4.6  CL 104  --  101  CO2 24  --  30  GLUCOSE 119*  --  103*  BUN 14  --  15  CREATININE 0.97  --  1.14  CALCIUM 9.3  --  9.3  MG  --  2.2  --    Liver Function Tests: No results for input(s): AST, ALT, ALKPHOS, BILITOT, PROT, ALBUMIN in the last 72 hours. No results for input(s): LIPASE, AMYLASE in the last 72 hours. CBC:  Recent Labs  06/13/16 1320  WBC 8.7  NEUTROABS 6.8  HGB 14.8  HCT 44.3  MCV 94.5  PLT 175   Cardiac Enzymes: No results for input(s): CKTOTAL, CKMB, CKMBINDEX, TROPONINI in the last 72 hours. BNP: No results for input(s): PROBNP in the last 72 hours. D-Dimer: No results for input(s): DDIMER in the last 72 hours. Hemoglobin A1C: No results for input(s): HGBA1C in the last 72 hours. Fasting Lipid  Panel:  Recent Labs  06/14/16 0305  CHOL 124  HDL 54  LDLCALC 60  TRIG 48  CHOLHDL 2.3   Thyroid Function Tests:  Recent Labs  06/13/16 1526  TSH 0.479     Radiology/Studies:  Dg Chest Port 1 View  Result Date: 06/13/2016 CLINICAL DATA:  Tachycardia. Atrial fibrillation. Shortness of breath. EXAM: PORTABLE CHEST 1 VIEW COMPARISON:  None. FINDINGS: The heart size and mediastinal contours are within normal limits. Both lungs are clear. The visualized skeletal structures are unremarkable. IMPRESSION: Normal chest. Electronically Signed   By: Francene BoyersJames  Maxwell M.D.   On: 06/13/2016 13:58   Ecg yesterday showed NSR with QTc 492. Anterolateral T wave abnormality. Today Ecg shows Afib with RVR  Echo is pending.   PHYSICAL EXAM General: Well developed, obese, in no acute distress. Head: Normocephalic, atraumatic, sclera non-icteric, oropharynx is clear Neck: Negative for carotid  bruits. JVD not elevated. No adenopathy Lungs: Clear bilaterally to auscultation without wheezes, rales, or rhonchi. Breathing is unlabored. Heart: IRRR S1 S2 without murmurs, rubs, or gallops.  Abdomen: Soft, non-tender, non-distended with normoactive bowel sounds. No hepatomegaly. No rebound/guarding. No obvious abdominal masses. Msk:  Strength and tone appears normal for age. Extremities: 1+ edema.  Distal pedal pulses are 2+ and equal bilaterally. Neuro: Alert and oriented X 3. Moves all extremities spontaneously. Psych:  Responds to questions appropriately with a normal affect.  ASSESSMENT AND PLAN: 1. Afib with RVR. S/p successful DCCV yesterday but Afib recurred at 4 am today. Rate fast despite IV cardizem and oral metoprolol. Discussed further options for Afib treatment with Dr. Graciela Husbands and patient. Given diastolic CHF and LVH he is probably not a good candidate for a 1C antiarrhythmic drug. With baseline prolonged QT he is not favorable for Tikosyn. Amiodarone is an option but given his young age I am  concerned about risk of long term toxicity. After discussion with Dr. Graciela Husbands will plan on loading with amiodarone. Repeat DCCV Monday. Then refer to EP for consideration for Afib ablation. 2. Acute diastolic CHF. Excellent diuresis. I/O negative 4460 cc yesterday. Negative 7660 cc since admit. Will transition to po lasix. Echo pending. Patient has LVH by history. Will need to make sure LVH is not HCM for long term prognosis. No family history of HCM or sudden death. Both parents lived to be in 2s. 3. HTN with hypertensive heart disease 4. Obesity. Assessment of possible sleep apnea pending.  Present on Admission: . Atrial fibrillation with RVR (HCC) . Acute diastolic CHF (congestive heart failure) (HCC) . Essential hypertension . Hypertensive heart disease with congestive heart failure (HCC)   Signed, Peter [place], MDFACC 06/15/2016 8:47 AM

## 2016-06-15 NOTE — Progress Notes (Signed)
Received call from [facility] that pt appeared to have convert to A fib on the monitor and 12 lead confirmed that pt was in Afib with RVR with rates in the 140-150s. Pt is asymptomatic and VSS. Cardiologist on call notified. Orders received and Cardizem gtt started. Will continue to monitor.

## 2016-06-15 NOTE — Progress Notes (Signed)
  Endoscopy closed on Monday 06/18/2016. DCCV scheduled for 06/19/2016 at 1100. Will need to be NPO after midnight prior to procedure.   Signed, Ellsworth Lennox, PA-C 06/15/2016, 3:09 PM

## 2016-06-16 DIAGNOSIS — I5021 Acute systolic (congestive) heart failure: Secondary | ICD-10-CM

## 2016-06-16 MED ORDER — SPIRONOLACTONE 25 MG PO TABS
12.5000 mg | ORAL_TABLET | Freq: Every day | ORAL | Status: DC
  Administered 2016-06-16 – 2016-06-18 (×3): 12.5 mg via ORAL
  Filled 2016-06-16 (×3): qty 1

## 2016-06-16 MED ORDER — DIGOXIN 125 MCG PO TABS
0.1250 mg | ORAL_TABLET | Freq: Every day | ORAL | Status: DC
Start: 2016-06-16 — End: 2016-06-18
  Administered 2016-06-16 – 2016-06-18 (×3): 0.125 mg via ORAL
  Filled 2016-06-16 (×3): qty 1

## 2016-06-16 MED ORDER — TORSEMIDE 20 MG PO TABS
40.0000 mg | ORAL_TABLET | Freq: Every day | ORAL | Status: DC
  Administered 2016-06-16 – 2016-06-18 (×3): 40 mg via ORAL
  Filled 2016-06-16 (×3): qty 2

## 2016-06-16 MED ORDER — METOPROLOL TARTRATE 50 MG PO TABS
50.0000 mg | ORAL_TABLET | Freq: Two times a day (BID) | ORAL | Status: DC
  Administered 2016-06-16 – 2016-06-18 (×4): 50 mg via ORAL
  Filled 2016-06-16 (×4): qty 1

## 2016-06-16 NOTE — Progress Notes (Signed)
SUBJECTIVE  Feels good. No dyspnea or palpitations.   HRs in 80s on IV amio and cardizem gtt + oral toprol   EF 10-15% on echo   TELEMETRY: Reviewed telemetry pt in atrial fibrillation rates 80-90s  Scheduled Meds: . furosemide  40 mg Oral Daily  . metoprolol tartrate  100 mg Oral BID  . rivaroxaban  20 mg Oral Q supper  . sodium chloride flush  3 mL Intravenous Q12H  . sodium chloride flush  3 mL Intravenous Q12H   Continuous Infusions: . sodium chloride    . amiodarone 30 mg/hr (06/16/16 1200)  . dilTIAZem HCl-Dextrose 15 mg/hr (06/16/16 1210)   PRN Meds:.sodium chloride, acetaminophen, ALPRAZolam, ondansetron (ZOFRAN) IV, sodium chloride flush, sodium chloride flush, zolpidem  Vitals:   06/16/16 0805 06/16/16 0930 06/16/16 1117 06/16/16 1130  BP: 91/76 106/76 (!) 116/100 100/73  Pulse: 82  96   Resp: 16  18   Temp: 98.1 F (36.7 C)  98 F (36.7 C)   TempSrc: Oral  Oral   SpO2: 99%  98%   Weight:      Height:        Intake/Output Summary (Last 24 hours) at 06/16/16 1322 Last data filed at 06/16/16 1210  Gross per 24 hour  Intake          2027.77 ml  Output             1550 ml  Net           477.77 ml   Filed Weights   06/14/16 0700 06/15/16 0500 06/16/16 0402  Weight: 132 kg (291 lb 0.1 oz) 124.9 kg (275 lb 6.4 oz) 122.8 kg (270 lb 12.8 oz)      . furosemide  40 mg Oral Daily  . metoprolol tartrate  100 mg Oral BID  . rivaroxaban  20 mg Oral Q supper  . sodium chloride flush  3 mL Intravenous Q12H  . sodium chloride flush  3 mL Intravenous Q12H   . sodium chloride    . amiodarone 30 mg/hr (06/16/16 1200)  . dilTIAZem HCl-Dextrose 15 mg/hr (06/16/16 1210)    LABS: Basic Metabolic Panel:  Recent Labs  54/06/8107/30/17 1716 06/14/16 0305  NA  --  139  K  --  4.6  CL  --  101  CO2  --  30  GLUCOSE  --  103*  BUN  --  15  CREATININE  --  1.14  CALCIUM  --  9.3  MG 2.2  --    Liver Function Tests: No results for input(s): AST, ALT, ALKPHOS,  BILITOT, PROT, ALBUMIN in the last 72 hours. No results for input(s): LIPASE, AMYLASE in the last 72 hours. CBC: No results for input(s): WBC, NEUTROABS, HGB, HCT, MCV, PLT in the last 72 hours. Cardiac Enzymes: No results for input(s): CKTOTAL, CKMB, CKMBINDEX, TROPONINI in the last 72 hours. BNP: No results for input(s): PROBNP in the last 72 hours. D-Dimer: No results for input(s): DDIMER in the last 72 hours. Hemoglobin A1C: No results for input(s): HGBA1C in the last 72 hours. Fasting Lipid Panel:  Recent Labs  06/14/16 0305  CHOL 124  HDL 54  LDLCALC 60  TRIG 48  CHOLHDL 2.3   Thyroid Function Tests:  Recent Labs  06/13/16 1526  TSH 0.479     Radiology/Studies:  No results found.     PHYSICAL EXAM General: Well developed, obese, in no acute distress. Head: Normocephalic, atraumatic, sclera non-icteric, oropharynx is clear  Neck: Negative for carotid bruits. JVP hard to see. No adenopathy Lungs: Clear bilaterally to auscultation without wheezes, rales, or rhonchi. Breathing is unlabored. Heart: IRRR S1 S2 without murmurs, rubs, or gallops.  Abdomen: Soft, non-tender, non-distended with normoactive bowel sounds. No hepatomegaly. No rebound/guarding. No obvious abdominal masses. Msk:  Strength and tone appears normal for age. Extremities: cool 2+ edema.  Distal pedal pulses are 2+ and equal bilaterally. Neuro: Alert and oriented X 3. Moves all extremities spontaneously. Psych:  Responds to questions appropriately with a normal affect.  ASSESSMENT AND PLAN: 1. Afib with RVR. S/p successful DCCV 8/31 but Afib recurred on 9/1 --Now on IV amio, cardizem and po Toprol with rates in 80-90s --With low EF will stop cardizem and cut Toprol to 50 bid --Continue amio load. DC-CV on Tuesday --Continue Xarelto --Will need AF ablation once EF recovers to avoid long-term amio exposureThen refer to EP for consideration for Afib ablation. 2. Acute systolic CHF.  --Echo  (8/31) reviewed personally. EF 10-15% with severe biventricular dysfunction. Likely due to AF --Volume status still up. Extremities cool --Continue diuresis. Wil switch to torsemide. Cut b-blocker. Add digoxin and spiro --BP too low for ACE/ARB durrently  3. HTN with hypertensive heart disease 4. Obesity with possible OSA --needs outpatient sleep study    Russell Meres, MD 06/16/2016 1:22 PM

## 2016-06-17 ENCOUNTER — Inpatient Hospital Stay (HOSPITAL_COMMUNITY): Payer: BLUE CROSS/BLUE SHIELD | Admitting: Anesthesiology

## 2016-06-17 ENCOUNTER — Encounter (HOSPITAL_COMMUNITY)
Admission: EM | Disposition: A | Discharge status: Home / Self Care | Payer: Self-pay | Source: Home / Self Care | Attending: Cardiology

## 2016-06-17 DIAGNOSIS — I43 Cardiomyopathy in diseases classified elsewhere: Secondary | ICD-10-CM

## 2016-06-17 DIAGNOSIS — R000 Tachycardia, unspecified: Secondary | ICD-10-CM

## 2016-06-17 DIAGNOSIS — I5021 Acute systolic (congestive) heart failure: Secondary | ICD-10-CM

## 2016-06-17 HISTORY — PX: CARDIOVERSION: SHX1299

## 2016-06-17 LAB — BASIC METABOLIC PANEL
Anion gap: 11 (ref 5–15)
BUN: 22 mg/dL — AB (ref 6–20)
CALCIUM: 9.4 mg/dL (ref 8.9–10.3)
CHLORIDE: 99 mmol/L — AB (ref 101–111)
CO2: 29 mmol/L (ref 22–32)
CREATININE: 1.44 mg/dL — AB (ref 0.61–1.24)
GFR, EST NON AFRICAN AMERICAN: 54 mL/min — AB (ref >60)
Glucose, Bld: 94 mg/dL (ref 65–99)
Potassium: 3.8 mmol/L (ref 3.5–5.1)
SODIUM: 139 mmol/L (ref 135–145)

## 2016-06-17 SURGERY — CARDIOVERSION
Anesthesia: Monitor Anesthesia Care

## 2016-06-17 MED ORDER — SODIUM CHLORIDE 0.9% FLUSH
3.0000 mL | Freq: Two times a day (BID) | INTRAVENOUS | Status: DC
  Administered 2016-06-17: 3 mL via INTRAVENOUS

## 2016-06-17 MED ORDER — SODIUM CHLORIDE 0.9% FLUSH: 3.0000 mL | INTRAVENOUS | Status: DC | PRN

## 2016-06-17 MED ORDER — AMIODARONE IV BOLUS ONLY 150 MG/100ML
150.0000 mg | Freq: Once | INTRAVENOUS | Status: AC
  Administered 2016-06-17: 150 mg via INTRAVENOUS

## 2016-06-17 MED ORDER — LIDOCAINE HCL (CARDIAC) 20 MG/ML IV SOLN
INTRAVENOUS | Status: DC | PRN
  Administered 2016-06-17: 20 mg via INTRATRACHEAL

## 2016-06-17 MED ORDER — SODIUM CHLORIDE 0.9 % IV SOLN
250.0000 mL | INTRAVENOUS | Status: DC
  Administered 2016-06-17: 250 mL via INTRAVENOUS

## 2016-06-17 MED ORDER — PROPOFOL 10 MG/ML IV BOLUS
INTRAVENOUS | Status: DC | PRN
  Administered 2016-06-17: 80 mg via INTRAVENOUS

## 2016-06-17 NOTE — Anesthesia Preprocedure Evaluation (Addendum)
Anesthesia Evaluation  Patient identified by MRN, date of birth, ID band Patient awake    Reviewed: Allergy & Precautions, NPO status , Patient's Chart, lab work & pertinent test results  Airway Mallampati: II  TM Distance: >3 FB Neck ROM: Full    Dental  (+) Teeth Intact, Dental Advisory Given   Pulmonary    breath sounds clear to auscultation       Cardiovascular hypertension, +CHF  + dysrhythmias Atrial Fibrillation  Rhythm:Regular Rate:Normal  Echo (8/31) EF 10-15% with severe biventricular dysfunction. Likely due to AF   Neuro/Psych    GI/Hepatic   Endo/Other    Renal/GU      Musculoskeletal   Abdominal (+) + obese,   Peds  Hematology   Anesthesia Other Findings   Reproductive/Obstetrics                             Anesthesia Physical  Anesthesia Plan  ASA: III  Anesthesia Plan: MAC   Post-op Pain Management:    Induction: Intravenous  Airway Management Planned: Mask  Additional Equipment:   Intra-op Plan:   Post-operative Plan:   Informed Consent: I have reviewed the patients History and Physical, chart, labs and discussed the procedure including the risks, benefits and alternatives for the proposed anesthesia with the patient or authorized representative who has indicated his/her understanding and acceptance.     Plan Discussed with: CRNA and Anesthesiologist  Anesthesia Plan Comments:         Anesthesia Quick Evaluation  

## 2016-06-17 NOTE — Transfer of Care (Signed)
Immediate Anesthesia Transfer of Care Note  Patient: Russell Arnold  Procedure(s) Performed: Procedure(s): CARDIOVERSION (N/A)  Patient Location: [phone]  Anesthesia Type:General  Level of Consciousness: awake, alert  and oriented  Airway & Oxygen Therapy: Patient Spontanous Breathing and room air  Post-op Assessment: Report given to RN and Post -op Vital signs reviewed and stable  Post vital signs: Reviewed and stable  Last Vitals:  Vitals:   06/17/16 1200 06/17/16 1500  BP:    Pulse:    Resp:    Temp: 36.9 C 36.6 C    Last Pain:  Vitals:   06/17/16 1500  TempSrc: Oral  PainSc:       Patients Stated Pain Goal: 2 (06/14/16 0700)  Complications: No apparent anesthesia complications

## 2016-06-17 NOTE — Anesthesia Procedure Notes (Signed)
Date/Time: 06/17/2016 4:39 PM Performed by: Nicholos Johns Pre-anesthesia Checklist: Patient identified, Emergency Drugs available, Suction available, Patient being monitored and Timeout performed Patient Re-evaluated:Patient Re-evaluated prior to inductionOxygen Delivery Method: Ambu bag Preoxygenation: Pre-oxygenation with 100% oxygen Intubation Type: IV induction Ventilation: Mask ventilation without difficulty

## 2016-06-17 NOTE — Anesthesia Postprocedure Evaluation (Signed)
Anesthesia Post Note  Patient: Russell Arnold  Procedure(s) Performed: Procedure(s) (LRB): CARDIOVERSION (N/A)  Patient location during evaluation: PACU Anesthesia Type: MAC Level of consciousness: awake and alert Pain management: pain level controlled Vital Signs Assessment: post-procedure vital signs reviewed and stable Respiratory status: spontaneous breathing, nonlabored ventilation, respiratory function stable and patient connected to nasal cannula oxygen Cardiovascular status: stable and blood pressure returned to baseline Anesthetic complications: no    Last Vitals:  Vitals:   06/17/16 1200 06/17/16 1500  BP:    Pulse:    Resp:    Temp: 36.9 C 36.6 C    Last Pain:  Vitals:   06/17/16 1500  TempSrc: Oral  PainSc:                  [name]

## 2016-06-17 NOTE — Progress Notes (Signed)
Patient doing well this morning, states he feels good and would like to go home today. Patient would like to discuss discharge with physician and would return on the planned day for cardioversion. Will notify physician of issue and follow up as needed. Good appetite, vital signs are stable, no complaint of pain. Only issue is he says he is "going stir crazy just laying around in this place".

## 2016-06-17 NOTE — CV Procedure (Addendum)
     DIRECT CURRENT CARDIOVERSION  NAME:  Russell Arnold   MRN: [id number] DOB:  11/26/1960   ADMIT DATE: 06/13/2016   INDICATIONS: Atrial fibrillation/atrial flutter   PROCEDURE:   Informed consent was obtained prior to the procedure. The risks, benefits and alternatives for the procedure were discussed and the patient comprehended these risks. Once an appropriate time out was taken, the patient had the defibrillator pads placed in the anterior and posterior position. The patient then underwent sedation by the anesthesia service with IV diprivan. Once an appropriate level of sedation was achieved, the patient received a single biphasic, synchronized 200J shock with prompt conversion to sinus rhythm. No apparent complications.  Bensimhon, Daniel,MD 4:36 PM

## 2016-06-17 NOTE — Progress Notes (Addendum)
SUBJECTIVE:  No complaints except wants to go home  OBJECTIVE:   Vitals:   Vitals:   06/17/16 0349 06/17/16 0457 06/17/16 0500 06/17/16 0735  BP:  (!) 123/98    Pulse:   (!) 122 (!) 123  Resp:  18  20  Temp:  98.1 F (36.7 C)  97.8 F (36.6 C)  TempSrc:  Oral  Oral  SpO2:   97% 98%  Weight: 266 lb 15.6 oz (121.1 kg)     Height:       I&O's:   Intake/Output Summary (Last 24 hours) at 06/17/16 1055 Last data filed at 06/17/16 0800  Gross per 24 hour  Intake          1240.35 ml  Output             6475 ml  Net         -5234.65 ml   TELEMETRY: Reviewed telemetry pt in atrial flutter with RVR at 120bpm:     PHYSICAL EXAM General: Well developed, well nourished, in no acute distress Head: Eyes PERRLA, No xanthomas.   Normal cephalic and atramatic  Lungs:   Clear bilaterally to auscultation and percussion. Heart:   tachycardic S1 S2 Pulses are 2+ & equal. Abdomen: Bowel sounds are positive, abdomen soft and non-tender without masses  Msk:  Back normal, normal gait. Normal strength and tone for age. Extremities:   No clubbing, cyanosis or edema.  DP +1 Neuro: Alert and oriented X 3. Psych:  Good affect, responds appropriately   LABS: Basic Metabolic Panel:  Recent Labs  16/07/9608/03/17 0212  NA 139  K 3.8  CL 99*  CO2 29  GLUCOSE 94  BUN 22*  CREATININE 1.44*  CALCIUM 9.4   Liver Function Tests: No results for input(s): AST, ALT, ALKPHOS, BILITOT, PROT, ALBUMIN in the last 72 hours. No results for input(s): LIPASE, AMYLASE in the last 72 hours. CBC: No results for input(s): WBC, NEUTROABS, HGB, HCT, MCV, PLT in the last 72 hours. Cardiac Enzymes: No results for input(s): CKTOTAL, CKMB, CKMBINDEX, TROPONINI in the last 72 hours. BNP: Invalid input(s): POCBNP D-Dimer: No results for input(s): DDIMER in the last 72 hours. Hemoglobin A1C: No results for input(s): HGBA1C in the last 72 hours. Fasting Lipid Panel: No results for input(s): CHOL, HDL, LDLCALC,  TRIG, CHOLHDL, LDLDIRECT in the last 72 hours. Thyroid Function Tests: No results for input(s): TSH, T4TOTAL, T3FREE, THYROIDAB in the last 72 hours.  Invalid input(s): FREET3 Anemia Panel: No results for input(s): VITAMINB12, FOLATE, FERRITIN, TIBC, IRON, RETICCTPCT in the last 72 hours. Coag Panel:   No results found for: INR, PROTIME  RADIOLOGY: Dg Chest Port 1 View  Result Date: 06/13/2016 CLINICAL DATA:  Tachycardia. Atrial fibrillation. Shortness of breath. EXAM: PORTABLE CHEST 1 VIEW COMPARISON:  None. FINDINGS: The heart size and mediastinal contours are within normal limits. Both lungs are clear. The visualized skeletal structures are unremarkable. IMPRESSION: Normal chest. Electronically Signed   By: Francene BoyersJames  Maxwell M.D.   On: 06/13/2016 13:58    ASSESSMENT AND PLAN: 1. Afib with RVR. S/p successful DCCV 8/31 but Afib recurred on 9/1.  Now appears to be in atrial flutter with RVR. --Now on IV amio, cardizem and po Toprol with rates in 120s --With low EF Cardizem stopped and reduced Toprol to 50 bid --Continue amio load. Will bolus with 150mg  now to see if we can get HR lowered.  DC-CV planned on Tuesday but he is very tachycardic.  He ate  this am.  Will make NPO now and plan for DCCV at 4pm today by Dr. Gala Romney. --Continue Xarelto - he has not missed any doses in the past 4 weeks. --Will need AF ablation once EF recovers to avoid long-term amio exposure.  Will refer to EP for consideration for Afib ablation outpt.  2. Acute systolic CHF.  --Echo (8/31) EF 10-15% with severe biventricular dysfunction. Likely due to AF --Volume status still up with LE edema.  Lungs clear.  Put out 5.6L yesterday and net neg 13L.  Weight down 4lbs. --Continue diuresis with torsemide.  Added digoxin and spiro.  Creatinine bumped slightly but needs more diuresis.  Will continue to follow. --BP soft for ACE/ARB currently   3. HTN with hypertensive heart disease  4. Obesity with possible  OSA --needs outpatient sleep study      Armanda Magic, MD  06/17/2016  10:55 AM

## 2016-06-17 NOTE — Progress Notes (Signed)
Patient cardioverted this afternoon. Doing very well and maintaining NSR. Vitals signs are stable, no complaint of pain, resting quietly. No questions or concerns at this time, call bell is in reach and will continue to monitor.

## 2016-06-18 ENCOUNTER — Encounter (HOSPITAL_COMMUNITY): Payer: Self-pay | Admitting: Internal Medicine

## 2016-06-18 LAB — BASIC METABOLIC PANEL
Anion gap: 11 (ref 5–15)
BUN: 16 mg/dL (ref 6–20)
CALCIUM: 9.4 mg/dL (ref 8.9–10.3)
CHLORIDE: 96 mmol/L — AB (ref 101–111)
CO2: 33 mmol/L — AB (ref 22–32)
CREATININE: 1.43 mg/dL — AB (ref 0.61–1.24)
GFR calc Af Amer: >60 mL/min (ref >60)
GFR calc non Af Amer: 54 mL/min — ABNORMAL LOW (ref >60)
GLUCOSE: 104 mg/dL — AB (ref 65–99)
Potassium: 3.7 mmol/L (ref 3.5–5.1)
Sodium: 140 mmol/L (ref 135–145)

## 2016-06-18 MED ORDER — SPIRONOLACTONE 25 MG PO TABS: 12.5000 mg | ORAL_TABLET | Freq: Every day | ORAL | 5 refills | Status: DC

## 2016-06-18 MED ORDER — LOSARTAN POTASSIUM 25 MG PO TABS: 25.0000 mg | ORAL_TABLET | Freq: Every day | ORAL | 5 refills | Status: DC

## 2016-06-18 MED ORDER — AMIODARONE HCL 200 MG PO TABS: 200.0000 mg | ORAL_TABLET | Freq: Two times a day (BID) | ORAL | 5 refills | Status: DC

## 2016-06-18 MED ORDER — TORSEMIDE 20 MG PO TABS: 40.0000 mg | ORAL_TABLET | Freq: Every day | ORAL | 5 refills | Status: DC

## 2016-06-18 MED ORDER — AMIODARONE HCL 200 MG PO TABS
400.0000 mg | ORAL_TABLET | Freq: Every day | ORAL | Status: DC
  Administered 2016-06-18: 400 mg via ORAL
  Filled 2016-06-18: qty 2

## 2016-06-18 MED ORDER — DIGOXIN 125 MCG PO TABS: 0.1250 mg | ORAL_TABLET | Freq: Every day | ORAL | 5 refills | Status: DC

## 2016-06-18 NOTE — Consult Note (Signed)
ELECTROPHYSIOLOGY CONSULT NOTE    Primary Cardiology:  Dr Hanley Haysosario  Referring Physician:  Dr Mayford Knifeurner  Admit Date: 06/13/2016  Reason for consultation:  Afib/ atrial flutter  Russell Arnold is a 55 y.o. male with a h/o persistent afib and typical appearing atrial flutter admitted with pgoressive SOB.  He has had difficulty with RVR related to his atrial arrhythmias.  He has been found to have reduced EF which is felt to be likely tachycardia mediated (prior echo and myoview per pt from Dr Hanley Haysosario unremarkable).  He is mostly unaware of palpitations and is most concerned with SOB.  He has been started on IV amiodarone.   He is on xarelto for stroke prevention.  He required cardioversion by Dr Gala RomneyBensimhon yesterday.  EP is consulted for further evaluation.  Today, he denies symptoms of palpitations, chest pain,orthopnea, PND, lower extremity edema, dizziness, presyncope, syncope, or neurologic sequela. The patient is tolerating medications without difficulties and is otherwise without complaint today.   Past Medical History:  Diagnosis Date  . A-fib (HCC)   . Hypertension    Past Surgical History:  Procedure Laterality Date  . APPENDECTOMY    . CARDIOVERSION N/A 06/14/2016   Procedure: CARDIOVERSION;  Surgeon: Peter M SwazilandJordan, MD;  [facility];  Service: Cardiovascular;  Laterality: N/A;  . CARDIOVERSION N/A 06/17/2016   Procedure: CARDIOVERSION;  Surgeon: Dolores Pattyaniel R Bensimhon, MD;  [facility];  Service: Cardiovascular;  Laterality: N/A;    . amiodarone  400 mg Oral Daily  . digoxin  0.125 mg Oral Daily  . metoprolol tartrate  50 mg Oral BID  . rivaroxaban  20 mg Oral Q supper  . sodium chloride flush  3 mL Intravenous Q12H  . spironolactone  12.5 mg Oral Daily  . torsemide  40 mg Oral Daily      Allergies  Allergen Reactions  . Amoxicillin Hives    Hives - noted "red splotches"    Social History   Social History  . Marital status: Married    Spouse name: N/A  .  Number of children: N/A  . Years of education: N/A   Occupational History  . Company secretaryCarpet cleaner    Social History Main Topics  . Smoking status: Never Smoker  . Smokeless tobacco: Former NeurosurgeonUser    Types: Chew  . Alcohol use No  . Drug use: No  . Sexual activity: Not on file   Other Topics Concern  . Not on file   Social History Narrative  . No narrative on file    Family History  Problem Relation Age of Onset  . Heart failure Mother     ROS- All systems are reviewed and negative except as per the HPI above  Physical Exam: Telemetry: Vitals:   06/17/16 2040 06/18/16 0003 06/18/16 0500 06/18/16 0750  BP: 101/81 (!) 130/103 (!) 150/110 125/90  Pulse: 96 83 82 90  Resp:    18  Temp:  98.4 F (36.9 C) 98.2 F (36.8 C) 97.9 F (36.6 C)  TempSrc:  Oral Oral Oral  SpO2: 98% 100% 99% 97%  Weight:   258 lb 6.4 oz (117.2 kg)   Height:        GEN- The patient is well appearing, alert and oriented x 3 today.   Head- normocephalic, atraumatic Eyes-  Sclera clear, conjunctiva pink Ears- hearing intact Oropharynx- clear Neck- supple,   Lungs- Clear to ausculation bilaterally, normal work of breathing Heart- Regular rate and rhythm, no murmurs, rubs or gallops,  PMI not laterally displaced GI- soft, NT, ND, + BS Extremities- no clubbing, cyanosis, + edema MS- no significant deformity or atrophy Skin- no rash or lesion Psych- euthymic mood, full affect Neuro- strength and sensation are intact  EKGs reviewed  Labs:   Lab Results  Component Value Date   WBC 8.7 06/13/2016   HGB 14.8 06/13/2016   HCT 44.3 06/13/2016   MCV 94.5 06/13/2016   PLT 175 06/13/2016    Recent Labs Lab 06/18/16 0249  NA 140  K 3.7  CL 96*  CO2 33*  BUN 16  CREATININE 1.43*  CALCIUM 9.4  GLUCOSE 104*   No results found for: CKTOTAL, CKMB, CKMBINDEX, TROPONINI Lab Results  Component Value Date   CHOL 124 06/14/2016   Lab Results  Component Value Date   HDL 54 06/14/2016   Lab  Results  Component Value Date   LDLCALC 60 06/14/2016   Lab Results  Component Value Date   TRIG 48 06/14/2016   Lab Results  Component Value Date   CHOLHDL 2.3 06/14/2016   No results found for: LDLDIRECT    Echo: reviewed  ASSESSMENT AND PLAN:   1. Persistent atrial fibrillation and atrial flutter The patient has symptomatic atrial arrhythmias.  He has very few AAD options due to reduced EF.  He has been placed on IV amiodarone.  At this point, he is very anxious to go home. I will therefore convert to oral amiodarone 200mg  BID.  Follow-up this thurs or Friday in af clinic. Convert metoprolol to coreg 12.5mg  BID at discharge. Continue xarelto (chads2vasc score is 2). May consider ablation once he is more stable.  2. Acute systolic dysfunction Likely due to AF with RVR Convert metoprolol to coreg Add losartan 25mg  daily if not contraindicated May require cath if EF does not recover with sinus rhythm  3. HTN Stable No change required today   OK to discharge from my standpoint Follow-up in AF clinic in 3 days Follow-up with me in 4 weeks  Hillis Range, MD 06/18/2016  10:14 AM

## 2016-06-18 NOTE — Discharge Instructions (Signed)
Aspirin and Your Heart  Aspirin is a medicine that affects the way blood clots. Aspirin can be used to help reduce the risk of blood clots, heart attacks, and other heart-related problems.  SHOULD I TAKE ASPIRIN? Your health care provider will help you determine whether it is safe and beneficial for you to take aspirin daily. Taking aspirin daily may be beneficial if you:  Have had a heart attack or chest pain.  Have undergone open heart surgery such as coronary artery bypass surgery (CABG).  Have had coronary angioplasty.  Have experienced a stroke or transient ischemic attack (TIA).  Have peripheral vascular disease (PVD).  Have chronic heart rhythm problems such as atrial fibrillation. ARE THERE ANY RISKS OF TAKING ASPIRIN DAILY? Daily use of aspirin can increase your risk of side effects. Some of these include:  Bleeding. Bleeding problems can be minor or serious. An example of a minor problem is a cut that does not stop bleeding. An example of a more serious problem is stomach bleeding or bleeding into the brain. Your risk of bleeding is increased if you are also taking non-steroidal anti-inflammatory medicine (NSAIDs).  Increased bruising.  Upset stomach.  An allergic reaction. People who have nasal polyps have an increased risk of developing an aspirin allergy. WHAT ARE SOME GUIDELINES I SHOULD FOLLOW WHEN TAKING ASPIRIN?   Take aspirin only as directed by your health care provider. Make sure you understand how much you should take and what form you should take. The two forms of aspirin are:  Non-enteric-coated. This type of aspirin does not have a coating and is absorbed quickly. Non-enteric-coated aspirin is usually recommended for people with chest pain. This type of aspirin also comes in a chewable form.  Enteric-coated. This type of aspirin has a special coating that releases the medicine very slowly. Enteric-coated aspirin causes less stomach upset than  non-enteric-coated aspirin. This type of aspirin should not be chewed or crushed.  Drink alcohol in moderation. Drinking alcohol increases your risk of bleeding. WHEN SHOULD I SEEK MEDICAL CARE?   You have unusual bleeding or bruising.  You have stomach pain.  You have an allergic reaction. Symptoms of an allergic reaction include:  Hives.  Itchy skin.  Swelling of the lips, tongue, or face.  You have ringing in your ears. WHEN SHOULD I SEEK IMMEDIATE MEDICAL CARE?   Your bowel movements are bloody, dark red, or black in color.  You vomit or cough up blood.  You have blood in your urine.  You cough, wheeze, or feel short of breath. If you have any of the following symptoms, this is an emergency. Do not wait to see if the pain will go away. Get medical help at once. Call your local emergency services (911 in the [place] Do not drive yourself to the hospital.  You have severe chest pain, especially if the pain is crushing or pressure-like and spreads to the arms, back, neck, or jaw.  You have stroke-like symptoms, such as:   Loss of vision.   Difficulty talking.   Numbness or weakness on one side of your body.   Numbness or weakness in your arm or leg.   Not thinking clearly or feeling confused.    This information is not intended to replace advice given to you by your health care provider. Make sure you discuss any questions you have with your health care provider.   Document Released: 09/13/2008 Document Revised: 10/22/2014 Document Reviewed: 01/06/2014 Elsevier Interactive Patient Education 2016  Else[employer] Inc[employer] You have an appointment set up with the [facility]illation [facility].  Multiple studies have shown that being followed by a dedicated atrial fibrillation clinic in addition to the standard care you receive from your other physicians improves health.   Atrial Fibrillation Atrial fibrillation is a type of heartbeat that is irregular or fast (rapid). If you  have this condition, your heart keeps quivering in a weird (chaotic) way. This condition can make it so your heart cannot pump blood normally. Having this condition gives a person more risk for stroke, heart failure, and other heart problems. There are different types of atrial fibrillation. Talk with your doctor to learn about the type that you have. HOME CARE  Take over-the-counter and prescription medicines only as told by your doctor.  If your doctor prescribed a blood-thinning medicine, take it exactly as told. Taking too much of it can cause bleeding. If you do not take enough of it, you will not have the protection that you need against stroke and other problems.  Do not use any tobacco products. These include cigarettes, chewing tobacco, and e-cigarettes. If you need help quitting, ask your doctor.  If you have apnea (obstructive sleep apnea), manage it as told by your doctor.  Do not drink alcohol.  Do not drink beverages that have caffeine. These include coffee, soda, and tea.  Maintain a healthy weight. Do not use diet pills unless your doctor says they are safe for you. Diet pills may make heart problems worse.  Follow diet instructions as told by your doctor.  Exercise regularly as told by your doctor.  Keep all follow-up visits as told by your doctor. This is important. GET HELP IF:  You notice a change in the speed, rhythm, or strength of your heartbeat.  You are taking a blood-thinning medicine and you notice more bruising.  You get tired more easily when you move or exercise. GET HELP RIGHT AWAY IF:  You have pain in your chest or your belly (abdomen).  You have sweating or weakness.  You feel sick to your stomach (nauseous).  You notice blood in your throw up (vomit), poop (stool), or pee (urine).  You are short of breath.  You suddenly have swollen feet and ankles.  You feel dizzy.  Your suddenly get weak or numb in your face, arms, or legs, especially  if it happens on one side of your body.  You have trouble talking, trouble understanding, or both.  Your face or your eyelid droops on one side. These symptoms may be an emergency. Do not wait to see if the symptoms will go away. Get medical help right away. Call your local emergency services (911 in the [place] Do not drive yourself to the hospital.   This information is not intended to replace advice given to you by your health care provider. Make sure you discuss any questions you have with your health care provider.   Document Released: 07/10/2008 Document Revised: 06/22/2015 Document Reviewed: 01/26/2015 Elsevier Interactive Patient Education 2016 Else[employer] Inc[employer]  Cardiac Ablation Cardiac ablation is a procedure to disable a small amount of heart tissue in very specific places. The heart has many electrical connections. Sometimes these connections are abnormal and can cause the heart to beat very fast or irregularly. By disabling some of the problem areas, heart rhythm can be improved or made normal. Ablation is done for people who:   Have Wolff-Parkinson-White syndrome.   Have other fast heart rhythms (tachycardia).   Have  taken medicines for an abnormal heart rhythm (arrhythmia) that resulted in:   No success.   Side effects.   May have a high-risk heartbeat that could result in death.  LET [facility] CARE PROVIDER KNOW ABOUT:   Any allergies you have or any previous reactions you have had to X-ray dye, food (such as seafood), medicine, or tape.   All medicines you are taking, including vitamins, herbs, eye drops, creams, and over-the-counter medicines.   Previous problems you or members of your family have had with the use of anesthetics.   Any blood disorders you have.   Previous surgeries or procedures (such as a kidney transplant) you have had.   Medical conditions you have (such as kidney failure).  RISKS AND COMPLICATIONS Generally, cardiac ablation is  a safe procedure. However, problems can occur and include:   Increased risk of cancer. Depending on how long it takes to do the ablation, the dose of radiation can be high.  Bruising and bleeding where a thin, flexible tube (catheter) was inserted during the procedure.   Bleeding into the chest, especially into the sac that surrounds the heart (serious).  Need for a permanent pacemaker if the normal electrical system is damaged.   The procedure may not be fully effective, and this may not be recognized for months. Repeat ablation procedures are sometimes required. BEFORE THE PROCEDURE   Follow any instructions from your health care provider regarding eating and drinking before the procedure.   Take your medicines as directed at regular times with water, unless instructed otherwise by your health care provider. If you are taking diabetes medicine, including insulin, ask how you are to take it and if there are any special instructions you should follow. It is common to adjust insulin dosing the day of the ablation.  PROCEDURE  An ablation is usually performed in a catheterization laboratory with the guidance of fluoroscopy. Fluoroscopy is a type of X-ray that helps your health care provider see images of your heart during the procedure.   An ablation is a minimally invasive procedure. This means a small cut (incision) is made in either your neck or groin. Your health care provider will decide where to make the incision based on your medical history and physical exam.  An IV tube will be started before the procedure begins. You will be given an anesthetic or medicine to help you relax (sedative).  The skin on your neck or groin will be numbed. A needle will be inserted into a large vein in your neck or groin and catheters will be threaded to your heart.  A special dye that shows up on fluoroscopy pictures may be injected through the catheter. The dye helps your health care provider see  the area of the heart that needs treatment.  The catheter has electrodes on the tip. When the area of heart tissue that is causing the arrhythmia is found, the catheter tip will send an electrical current to the area and "scar" the tissue. Three types of energy can be used to ablate the heart tissue:   Heat (radiofrequency energy).   Laser energy.   Extreme cold (cryoablation).   When the area of the heart has been ablated, the catheter will be taken out. Pressure will be held on the insertion site. This will help the insertion site clot and keep it from bleeding. A bandage will be placed on the insertion site.  AFTER THE PROCEDURE   After the procedure, you will be taken  to a recovery area where your vital signs (blood pressure, heart rate, and breathing) will be monitored. The insertion site will also be monitored for bleeding.   You will need to lie still for 4-6 hours. This is to ensure you do not bleed from the catheter insertion site.    This information is not intended to replace advice given to you by your health care provider. Make sure you discuss any questions you have with your health care provider.   Document Released: 02/17/2009 Document Revised: 10/22/2014 Document Reviewed: 02/23/2013 Elsevier Interactive Patient Education 2016 Elsevier Inc.  Biventricular Pacemaker Implantation A pacemaker is a small, lightweight, battery-powered device that is placed (implanted) under the skin in the upper chest. Your health care provider may prescribe a pacemaker for you if your heartbeat is too slow (bradycardia). A biventricular pacemaker is a pulse generator connected by wires called leads that go into the two lower chambers on the right and left sides of your heart (ventricles). It is used to treat symptoms of heart failure. The pulse generator is a small computer run by a battery. The generator creates a regular electronic pulse. The pulse is sent through the leads, which go  through a blood vessel and into the ventricles of your heart. This type of pacemaker makes a weak heart more efficient. [facility] KNOW ABOUT:  Any allergies. Some allergies can cause serious problems during the procedure. Allergies to shellfish or agents, such as iodine, used in liquids that enhance specific areas of your body on X-ray images (contrast dyes) are especially problematic.   All medicines you take. These include vitamins, herbs, eye drops, over-the-counter medicines, and creams.   Use of steroids.   Problems with numbing medicines (anesthetics).  Bleeding problems.   Past surgeries.   Other health problems. RISKS AND COMPLICATIONS Implanting a biventricular pacemaker is usually a safe procedure but problems can occur. For example:   Too much bleeding may occur.   Infection may develop.   Blood vessels, your lungs, or your heart may be harmed.   The pacemaker may not make your condition better. BEFORE THE PROCEDURE   You may need to have blood tests, heart tests, or a chest X-ray done before the day of the procedure.   Ask your health care provider about changing or stopping your regular medicines.   Make plans to have someone drive you home.  Stop smoking at least 24 hours before the procedure.  Take a bath or shower the night before the procedure. You may need to scrub your chest with a special type of soap.  Do not eat or drink anything after midnight the night before your procedure. Ask if it is okay to take any needed medicine with a small sip of water. PROCEDURE The procedure to put a pacemaker in your chest is usually done at a hospital in a room that has a large X-ray machine called a fluoroscope. The machine will be above you during the procedure. It will help your health care provider see your heart during the procedure. Before the procedure:   Small monitors will be put on your body. They will be used to check your heart,  blood pressure, and oxygen level.  A needle will be put into a vein in your hand or arm. This is called an intravenous (IV) access tube. Fluids and medicine will flow directly into your body through the IV tube.  Your chest will be cleaned with a germ-killing (antiseptic) solution.  Your chest may be shaved.  You may be given medicine to help you relax (sedative).   You will be given a numbing medicine called a local anesthetic. This medicine will make your chest area have no feeling while the pacemaker is implanted. You will be sleepy but awake during the procedure. After you are numb, the procedure will begin. The health care provider will:   Make a small cut (incision). This will make a pocket deep under your skin that will hold the pulse generator.  Guide the leads through a large blood vessel into your heart and attach them to the heart muscles.  Test the pacemaker.  Close the incision with stitches, glue, or staples. AFTER THE PROCEDURE  You may feel pain. Some pain is normal. It may last a few days.   You may stay in a recovery area until the local anesthetic has worn off. Your blood pressure and pulse will be checked often. You will be taken to a room where your heartbeat will be monitored.  A chest X-ray will be taken. This checks that the pacemaker is in the right place.  The pacemaker will be checked before you go home. It can be adjusted if that is needed.   This information is not intended to replace advice given to you by your health care provider. Make sure you discuss any questions you have with your health care provider.   Document Released: 06/25/2012 Document Revised: 10/22/2014 Document Reviewed: 06/25/2012 Elsevier Interactive Patient Education [employer] We believe that enrollment in the atrial fibrillation clinic will allow [place] to better care for you.   The phone number to the Atrial Fibrillation Clinic is [phone]. The clinic is staffed Monday  through Friday from 8:30am to 5pm.  Parking Directions: The clinic is located in the Heart and Vascular Building connected to [facility]. 1)From [address] turn on to [address] and go to the 3rd entrance  (Heart and Vascular entrance) on the right. 2)Look to the right for Heart &Vascular Parking Garage. 3)A code for the entrance is required please call the clinic to receive this.   4)Take the elevators to the 1st floor. Registration is in the room with the glass walls at the end of the hallway.  If you have any trouble parking or locating the clinic, please dont hesitate to call [phone].

## 2016-06-18 NOTE — Progress Notes (Signed)
Nursing Note: Patient given PO Amiodarone @ 1015 with IV Amiodarone stopped at 1130.  IV flushed with saline and saline locked.  Patient ambulated x 1 lap with RN without difficulty at 1135, 2 laps with RN without difficulty at 1230, and 3 laps with spouse at 1305 without difficulty.  Patient remains in NSR with occasional PAC's noted.  Denies shortness of breath, chest pain, or palpitations.  Discharge teaching completed with patient and spouse.  IV removed.

## 2016-06-18 NOTE — Discharge Summary (Signed)
Discharge Summary    Patient ID: Russell Arnold,  MRN: [id number], DOB/AGE: July 10, 1961 55 y.o.  Admit date: 06/13/2016 Discharge date: 06/18/2016  Primary Care Provider: No primary care provider on file. Primary Cardiologist: Dr. Mayford Knifeurner   Discharge Diagnoses    Principal Problem:   Atrial fibrillation with RVR (HCC) Active Problems:   Acute diastolic CHF (congestive heart failure) (HCC)   Essential hypertension   Chronic anticoagulation-Xarelto   Hypertensive heart disease with congestive heart failure (HCC)   Tachycardia induced cardiomyopathy (HCC)   Acute systolic heart failure (HCC)   Allergies Allergies  Allergen Reactions  . Amoxicillin Hives    Hives - noted "red splotches"    Diagnostic Studies/Procedures   2D Echo 06/14/16  Study Conclusions  - Left ventricle: The cavity size was mildly dilated. The estimated   ejection fraction was in the range of 10% to 15%. Severe diffuse   hypokinesis. The study is not technically sufficient to allow   evaluation of LV diastolic function. - Ventricular septum: Septal motion showed abnormal function and   dyssynergy. - Aorta: Aortic root dimension: 46 mm (ED). - Ascending aorta: The ascending aorta was moderately dilated. - Mitral valve: There was moderate regurgitation. - Left atrium: The atrium was mildly dilated. - Right ventricle: The cavity size was mildly dilated. Systolic   function was moderately reduced. - Right atrium: The atrium was mildly dilated. - Atrial septum: No defect or patent foramen ovale was identified. - Pulmonary arteries: PA peak pressure: 48 mm Hg (S).  Impressions:  - Dilated cardiomyopathy. The right ventricular systolic pressure   was increased consistent with moderate pulmonary hypertension.  DCCV 06/17/16  INDICATIONS: Atrial fibrillation/atrial flutter   PROCEDURE:   Informed consent was obtained prior to the procedure. The risks, benefits and alternatives for the  procedure were discussed and the patient comprehended these risks. Once an appropriate time out was taken, the patient had the defibrillator pads placed in the anterior and posterior position. The patient then underwent sedation by the anesthesia service with IV diprivan. Once an appropriate level of sedation was achieved, the patient received a single biphasic, synchronized 200J shock with prompt conversion to sinus rhythm. No apparent complications.     History of Present Illness / Hospital Course    Russell Arnold is a 55 y.o. male with a h/o persistent afib and typical appearing atrial flutter who was admitted with progressive SOB on 06/13/16.He was in afb w/ RVR.  He has had difficulty with RVR related to his atrial arrhythmias.  He was found to have reduced EF, by echo which is felt to be likely tachycardia mediated (prior echo and myoview per pt from Dr Hanley Haysosario unremarkable). New EF is 10-15%. Cardizem was discontinued. He was started on IV amiodarone and continued on Xarelto for stroke prevention. On 06/17/16, he required cardioversion by Dr Gala RomneyBensimhon. He converted to NSR. He was transitioned to PO amiodarone. His BB was changed from metoprolol to Coreg. He was also treated with spironolactone, Losartan, Torsemide and digoxin. On 06/18/16, he was seen by Dr. Johney FrameAllred who determined he was stable for d/c home. He recommended he be continued on Amiodarone, 200 mg BID. He has f/u in the Afib clinic on 06/21/16. He will need a f/u BMP at time of office visit to monitor renal function and K. Dr. Johney FrameAllred, outlined that he may be considered for ablation once more stable.   Hospital Course     Consultants: electrophysiology    Discharge Vitals Blood  pressure 114/85, pulse 90, temperature 97.6 F (36.4 C), temperature source Oral, resp. rate 18, height 6\' 2"  (1.88 m), weight 258 lb 6.4 oz (117.2 kg), SpO2 99 %.  Filed Weights   06/16/16 0402 06/17/16 0349 06/18/16 0500  Weight: 270 lb 12.8 oz (122.8 kg) 266  lb 15.6 oz (121.1 kg) 258 lb 6.4 oz (117.2 kg)    Labs & Radiologic Studies    CBC No results for input(s): WBC, NEUTROABS, HGB, HCT, MCV, PLT in the last 72 hours. Basic Metabolic Panel  Recent Labs  06/17/16 0212 06/18/16 0249  NA 139 140  K 3.8 3.7  CL 99* 96*  CO2 29 33*  GLUCOSE 94 104*  BUN 22* 16  CREATININE 1.44* 1.43*  CALCIUM 9.4 9.4   Liver Function Tests No results for input(s): AST, ALT, ALKPHOS, BILITOT, PROT, ALBUMIN in the last 72 hours. No results for input(s): LIPASE, AMYLASE in the last 72 hours. Cardiac Enzymes No results for input(s): CKTOTAL, CKMB, CKMBINDEX, TROPONINI in the last 72 hours. BNP Invalid input(s): POCBNP D-Dimer No results for input(s): DDIMER in the last 72 hours. Hemoglobin A1C No results for input(s): HGBA1C in the last 72 hours. Fasting Lipid Panel No results for input(s): CHOL, HDL, LDLCALC, TRIG, CHOLHDL, LDLDIRECT in the last 72 hours. Thyroid Function Tests No results for input(s): TSH, T4TOTAL, T3FREE, THYROIDAB in the last 72 hours.  Invalid input(s): FREET3 _____________  Dg Chest Port 1 View  Result Date: 06/13/2016 CLINICAL DATA:  Tachycardia. Atrial fibrillation. Shortness of breath. EXAM: PORTABLE CHEST 1 VIEW COMPARISON:  None. FINDINGS: The heart size and mediastinal contours are within normal limits. Both lungs are clear. The visualized skeletal structures are unremarkable. IMPRESSION: Normal chest. Electronically Signed   By: Francene Boyers M.D.   On: 06/13/2016 13:58   Disposition   Pt is being discharged home today in good condition.  Follow-up Plans & Appointments    Follow-up Information    [facility] Follow up on 06/21/2016.   Specialty:  Cardiology Why:  at [employer] information: [address] [id number] mc [place] [address] [phone]         Discharge Instructions    Diet - low sodium heart healthy    Complete by:  As directed    Increase activity slowly    Complete by:  As directed      Discharge Medications   Current Discharge Medication List    START taking these medications   Details  amiodarone (PACERONE) 200 MG tablet Take 1 tablet (200 mg total) by mouth 2 (two) times daily. Qty: 60 tablet, Refills: 5    digoxin (LANOXIN) 0.125 MG tablet Take 1 tablet (0.125 mg total) by mouth daily. Qty: 30 tablet, Refills: 5    losartan (COZAAR) 25 MG tablet Take 1 tablet (25 mg total) by mouth daily. Qty: 30 tablet, Refills: 5    spironolactone (ALDACTONE) 25 MG tablet Take 0.5 tablets (12.5 mg total) by mouth daily. Qty: 30 tablet, Refills: 5    torsemide (DEMADEX) 20 MG tablet Take 2 tablets (40 mg total) by mouth daily. Qty: 30 tablet, Refills: 5      CONTINUE these medications which have NOT CHANGED   Details  albuterol (PROVENTIL HFA;VENTOLIN HFA) 108 (90 Base) MCG/ACT inhaler Inhale 1 puff into the lungs every 6 (six) hours as needed for wheezing or shortness of breath.    cholecalciferol (VITAMIN D) 1000 units tablet Take 1,000 Units by mouth daily.  rivaroxaban (XARELTO) 20 MG TABS tablet Take 20 mg by mouth daily with supper.      STOP taking these medications     diltiazem (CARDIZEM) 60 MG tablet      lisinopril (PRINIVIL,ZESTRIL) 10 MG tablet             Outstanding Labs/Studies   F/u BMP at office post hospital f/u   Duration of Discharge Encounter   Greater than 30 minutes including physician time.  Signed, Robbie Lis PA-C 06/18/2016, 12:15 PM   Hillis Range MD

## 2016-06-19 ENCOUNTER — Encounter (HOSPITAL_COMMUNITY): Payer: Self-pay | Admitting: Anesthesiology

## 2016-06-19 SURGERY — CARDIOVERSION
Anesthesia: Monitor Anesthesia Care

## 2016-06-19 NOTE — Anesthesia Preprocedure Evaluation (Deleted)
Anesthesia Evaluation  Patient identified by MRN, date of birth, ID band Patient awake    Reviewed: Allergy & Precautions, NPO status , Patient's Chart, lab work & pertinent test results  Airway Mallampati: II  TM Distance: >3 FB Neck ROM: Full    Dental  (+) Teeth Intact, Dental Advisory Given   Pulmonary    breath sounds clear to auscultation       Cardiovascular hypertension, +CHF  + dysrhythmias Atrial Fibrillation  Rhythm:Regular Rate:Normal  Echo (8/31) EF 10-15% with severe biventricular dysfunction. Likely due to AF   Neuro/Psych    GI/Hepatic   Endo/Other    Renal/GU      Musculoskeletal   Abdominal (+) + obese,   Peds  Hematology   Anesthesia Other Findings   Reproductive/Obstetrics                             Anesthesia Physical  Anesthesia Plan  ASA: III  Anesthesia Plan: MAC   Post-op Pain Management:    Induction: Intravenous  Airway Management Planned: Mask  Additional Equipment:   Intra-op Plan:   Post-operative Plan:   Informed Consent: I have reviewed the patients History and Physical, chart, labs and discussed the procedure including the risks, benefits and alternatives for the proposed anesthesia with the patient or authorized representative who has indicated his/her understanding and acceptance.     Plan Discussed with: CRNA and Anesthesiologist  Anesthesia Plan Comments:         Anesthesia Quick Evaluation

## 2016-06-21 ENCOUNTER — Encounter (HOSPITAL_COMMUNITY): Payer: Self-pay | Admitting: Nurse Practitioner

## 2016-06-21 ENCOUNTER — Ambulatory Visit (HOSPITAL_COMMUNITY)
Admission: RE | Admit: 2016-06-21 | Discharge: 2016-06-21 | Disposition: A | Discharge status: Home / Self Care | Payer: BLUE CROSS/BLUE SHIELD | Source: Ambulatory Visit | Attending: Nurse Practitioner | Admitting: Nurse Practitioner

## 2016-06-21 VITALS — BP 148/76 | Ht 74.0 in | Wt 257.6 lb

## 2016-06-21 DIAGNOSIS — I10 Essential (primary) hypertension: Secondary | ICD-10-CM | POA: Diagnosis not present

## 2016-06-21 DIAGNOSIS — Z880 Allergy status to penicillin: Secondary | ICD-10-CM | POA: Insufficient documentation

## 2016-06-21 DIAGNOSIS — Z7901 Long term (current) use of anticoagulants: Secondary | ICD-10-CM | POA: Diagnosis not present

## 2016-06-21 DIAGNOSIS — I481 Persistent atrial fibrillation: Secondary | ICD-10-CM | POA: Diagnosis not present

## 2016-06-21 DIAGNOSIS — I429 Cardiomyopathy, unspecified: Secondary | ICD-10-CM | POA: Diagnosis not present

## 2016-06-21 DIAGNOSIS — Z79899 Other long term (current) drug therapy: Secondary | ICD-10-CM | POA: Diagnosis not present

## 2016-06-21 DIAGNOSIS — K5900 Constipation, unspecified: Secondary | ICD-10-CM | POA: Insufficient documentation

## 2016-06-21 DIAGNOSIS — I4819 Other persistent atrial fibrillation: Secondary | ICD-10-CM

## 2016-06-21 MED ORDER — RIVAROXABAN 20 MG PO TABS: 20.0000 mg | ORAL_TABLET | Freq: Every day | ORAL | 6 refills | Status: DC

## 2016-06-21 MED ORDER — RANOLAZINE ER 500 MG PO TB12: 500.0000 mg | ORAL_TABLET | Freq: Two times a day (BID) | ORAL | 3 refills | Status: DC

## 2016-06-21 MED ORDER — CARVEDILOL 3.125 MG PO TABS: 3.1250 mg | ORAL_TABLET | Freq: Two times a day (BID) | ORAL | 2 refills | Status: DC

## 2016-06-21 MED ORDER — AMIODARONE HCL 200 MG PO TABS: 400.0000 mg | ORAL_TABLET | Freq: Two times a day (BID) | ORAL | 5 refills | Status: DC

## 2016-06-21 NOTE — Patient Instructions (Signed)
Your physician has recommended you make the following change in your medication:  1)Increase amiodarone 400mg  twice a day (2 of the 200mg  tablets twice a day) 2)Start Coreg 3.125mg  twice a day 3)Start Ranexa 500mg  twice a day

## 2016-06-21 NOTE — Progress Notes (Signed)
Primary Care Physician: No primary care provider on file. Referring Physician: [facility]Allred, [facility]   Russell Arnold is a 55 y.o. male with a h/o persistent  afib  that was newly diagnosed around 3 months ago, with hard to control v rates. He and his wife were vacationing in [place] and he started having issues with heart failure symptoms. He was admitted with progressive SOB on 8/30 and was in afib with RVR. He was found to have reduced EF at 10-15% thought to be TMC. Cardizem was discontinued and started on IV amiodarone and cardioverted to SR. He was started on Xarelto  for stroke prevention.  He returns 9/7 to the afib clinic  and feels well but unfortunately has returned to afib with v rate of 148 bpm. He does not feel the fast heart beat.His weight is stable down one pound from d/c at at 247 bpm. He continues loading on  amiodarone 200 mg bid. Continues on xarelto. Needs sleep study for h/o snoring and will initiate order he has been constipated since discharge.  Today, he denies symptoms of palpitations, chest pain, shortness of breath, orthopnea, PND, lower extremity edema, dizziness, presyncope, syncope, or neurologic sequela. The patient is tolerating medications without difficulties and is otherwise without complaint today.   Past Medical History:  Diagnosis Date  . A-fib (HCC)   . Hypertension    Past Surgical History:  Procedure Laterality Date  . APPENDECTOMY    . CARDIOVERSION N/A 06/14/2016   Procedure: CARDIOVERSION;  Surgeon: Peter M SwazilandJordan, MD;  [facility];  Service: Cardiovascular;  Laterality: N/A;  . CARDIOVERSION N/A 06/17/2016   Procedure: CARDIOVERSION;  Surgeon: Dolores Pattyaniel R Bensimhon, MD;  [facility];  Service: Cardiovascular;  Laterality: N/A;    Current Outpatient Prescriptions  Medication Sig Dispense Refill  . albuterol (PROVENTIL HFA;VENTOLIN HFA) 108 (90 Base) MCG/ACT inhaler Inhale 1 puff into the lungs every 6 (six) hours as needed for  wheezing or shortness of breath.    Marland Kitchen. amiodarone (PACERONE) 200 MG tablet Take 2 tablets (400 mg total) by mouth 2 (two) times daily. 60 tablet 5  . cholecalciferol (VITAMIN D) 1000 units tablet Take 1,000 Units by mouth daily.    . digoxin (LANOXIN) 0.125 MG tablet Take 1 tablet (0.125 mg total) by mouth daily. 30 tablet 5  . losartan (COZAAR) 25 MG tablet Take 1 tablet (25 mg total) by mouth daily. 30 tablet 5  . rivaroxaban (XARELTO) 20 MG TABS tablet Take 1 tablet (20 mg total) by mouth daily with supper. 30 tablet 6  . spironolactone (ALDACTONE) 25 MG tablet Take 0.5 tablets (12.5 mg total) by mouth daily. (Patient taking differently: Take 25 mg by mouth daily. ) 30 tablet 5  . torsemide (DEMADEX) 20 MG tablet Take 2 tablets (40 mg total) by mouth daily. 30 tablet 5  . carvedilol (COREG) 3.125 MG tablet Take 1 tablet (3.125 mg total) by mouth 2 (two) times daily with a meal. 60 tablet 2  . ranolazine (RANEXA) 500 MG 12 hr tablet Take 1 tablet (500 mg total) by mouth 2 (two) times daily. 60 tablet 3   No current facility-administered medications for this encounter.     Allergies  Allergen Reactions  . Amoxicillin Hives    Hives - noted "red splotches"    Social History   Social History  . Marital status: Married    Spouse name: N/A  . Number of children: N/A  . Years of education:  N/A   Occupational History  . Company secretary    Social History Main Topics  . Smoking status: Never Smoker  . Smokeless tobacco: Former Neurosurgeon    Types: Chew  . Alcohol use No  . Drug use: No  . Sexual activity: Not on file   Other Topics Concern  . Not on file   Social History Narrative  . No narrative on file    Family History  Problem Relation Age of Onset  . Heart failure Mother     ROS- All systems are reviewed and negative except as per the HPI above  Physical Exam: Vitals:   06/21/16 0922  BP: (!) 148/76  Weight: 257 lb 9.6 oz (116.8 kg)  Height: 6\' 2"  (1.88 m)    GEN-  The patient is well appearing, alert and oriented x 3 today.   Head- normocephalic, atraumatic Eyes-  Sclera clear, conjunctiva pink Ears- hearing intact Oropharynx- clear Neck- supple, no JVP Lymph- no cervical lymphadenopathy Lungs- Clear to ausculation bilaterally, normal work of breathing Heart- Rapid irregular rate and rhythm, no murmurs, rubs or gallops, PMI not laterally displaced GI- soft, NT, ND, + BS Extremities- no clubbing, cyanosis, or edema MS- no significant deformity or atrophy Skin- no rash or lesion Psych- euthymic mood, full affect Neuro- strength and sensation are intact  EKG-afib at 148 bpm Epic records reviewed  Assessment and Plan: 1. Discussed with Dr. Johney Frame and Dr. Gala Romney Per Dr. Johney Frame, increase amiodarone to 200 mg 2 tabs bid and cardiovert in two weeks Continue xarelto  Add low dose coreg 3.125 mg bid Sleep study pending  To be considered for ablation when more stable  2. CHF Dr. Gala Romney suggested to add ranexa 500 mg bid for cardiomyopathy  Fluid status stable Continue spironolactone Continue torsemide  Daily weights  Avoid salt bmet today  3. Constipation Suggested combo OTC drug with senna and docusate  F/u Monday  Russell Arnold [name] [address] [place], [place] [address] [phone]

## 2016-06-22 ENCOUNTER — Ambulatory Visit (HOSPITAL_COMMUNITY)
Admission: RE | Admit: 2016-06-22 | Discharge: 2016-06-22 | Disposition: A | Discharge status: Home / Self Care | Payer: BLUE CROSS/BLUE SHIELD | Source: Ambulatory Visit | Attending: Nurse Practitioner | Admitting: Nurse Practitioner

## 2016-06-22 ENCOUNTER — Other Ambulatory Visit: Payer: Self-pay | Admitting: *Deleted

## 2016-06-22 DIAGNOSIS — I480 Paroxysmal atrial fibrillation: Secondary | ICD-10-CM

## 2016-06-22 DIAGNOSIS — I481 Persistent atrial fibrillation: Secondary | ICD-10-CM | POA: Diagnosis not present

## 2016-06-22 DIAGNOSIS — G4733 Obstructive sleep apnea (adult) (pediatric): Secondary | ICD-10-CM

## 2016-06-22 LAB — BASIC METABOLIC PANEL
Anion gap: 6 (ref 5–15)
BUN: 27 mg/dL — AB (ref 6–20)
CALCIUM: 9.7 mg/dL (ref 8.9–10.3)
CHLORIDE: 103 mmol/L (ref 101–111)
CO2: 30 mmol/L (ref 22–32)
CREATININE: 1.32 mg/dL — AB (ref 0.61–1.24)
GFR calc non Af Amer: 60 mL/min — ABNORMAL LOW (ref >60)
GLUCOSE: 119 mg/dL — AB (ref 65–99)
Potassium: 4.1 mmol/L (ref 3.5–5.1)
Sodium: 139 mmol/L (ref 135–145)

## 2016-06-22 NOTE — Progress Notes (Signed)
Pt in for BP/HR check today, as well as bmet .  Pt bp 124/84 and HR 136.  To be reviewed by Rudi Coco, NP

## 2016-06-25 ENCOUNTER — Ambulatory Visit (HOSPITAL_COMMUNITY)
Admission: RE | Admit: 2016-06-25 | Discharge: 2016-06-25 | Disposition: A | Discharge status: Home / Self Care | Payer: BLUE CROSS/BLUE SHIELD | Source: Ambulatory Visit | Attending: Nurse Practitioner | Admitting: Nurse Practitioner

## 2016-06-25 ENCOUNTER — Encounter (HOSPITAL_COMMUNITY): Payer: Self-pay | Admitting: Nurse Practitioner

## 2016-06-25 DIAGNOSIS — I480 Paroxysmal atrial fibrillation: Secondary | ICD-10-CM

## 2016-06-25 DIAGNOSIS — I4891 Unspecified atrial fibrillation: Secondary | ICD-10-CM | POA: Insufficient documentation

## 2016-06-25 LAB — COMPREHENSIVE METABOLIC PANEL
ALBUMIN: 4.5 g/dL (ref 3.5–5.0)
ALK PHOS: 105 U/L (ref 38–126)
ALT: 140 U/L — AB (ref 17–63)
AST: 61 U/L — AB (ref 15–41)
Anion gap: 9 (ref 5–15)
BUN: 25 mg/dL — AB (ref 6–20)
CHLORIDE: 100 mmol/L — AB (ref 101–111)
CO2: 31 mmol/L (ref 22–32)
CREATININE: 1.31 mg/dL — AB (ref 0.61–1.24)
Calcium: 10 mg/dL (ref 8.9–10.3)
GFR calc non Af Amer: >60 mL/min (ref >60)
GLUCOSE: 117 mg/dL — AB (ref 65–99)
Potassium: 4.2 mmol/L (ref 3.5–5.1)
SODIUM: 140 mmol/L (ref 135–145)
Total Bilirubin: 1.1 mg/dL (ref 0.3–1.2)
Total Protein: 7.7 g/dL (ref 6.5–8.1)

## 2016-06-25 LAB — DIGOXIN LEVEL: DIGOXIN LVL: 1.1 ng/mL (ref 0.8–2.0)

## 2016-06-25 NOTE — Patient Instructions (Signed)
Your physician has recommended you make the following change in your medication:  Hold digoxin the morning of your follow up appointment next week

## 2016-06-25 NOTE — Progress Notes (Addendum)
Pt in for EKG and labs today.  BP 124/82; HR by EKG 75.  EKG to be reviewed by Rudi Coco, NP.  Pt has returned to SR with increase of amiodarone 400 mg bid and will continue this dose for the next week. He does not feel any different in SR. Has noticed ringing of the ears and when meds reviewed with pharmacist, it may be ranexa. But since rhythm has returned to SR will not make any changes today. BMET, dig level today, will not be trough since he has taken but if level elevated will decrease dose by 50%. Will  get trough level next week.

## 2016-07-02 ENCOUNTER — Ambulatory Visit (HOSPITAL_COMMUNITY)
Admission: RE | Admit: 2016-07-02 | Discharge: 2016-07-02 | Disposition: A | Discharge status: Home / Self Care | Payer: BLUE CROSS/BLUE SHIELD | Source: Ambulatory Visit | Attending: Nurse Practitioner | Admitting: Nurse Practitioner

## 2016-07-02 DIAGNOSIS — I4891 Unspecified atrial fibrillation: Secondary | ICD-10-CM | POA: Insufficient documentation

## 2016-07-02 DIAGNOSIS — I480 Paroxysmal atrial fibrillation: Secondary | ICD-10-CM | POA: Diagnosis not present

## 2016-07-02 LAB — DIGOXIN LEVEL: DIGOXIN LVL: 0.5 ng/mL — AB (ref 0.8–2.0)

## 2016-07-02 MED ORDER — AMIODARONE HCL 200 MG PO TABS: 200.0000 mg | ORAL_TABLET | Freq: Two times a day (BID) | ORAL | 5 refills | Status: DC

## 2016-07-02 NOTE — Progress Notes (Addendum)
Pt in today for repeat EKG and digoxin level.  Pt HR 64 and BP 142/94.   Rudi Coco, NP to review EKG.  Pt is maintaining SR, pr int , 102 ms, qtc 433 ms,. Will decrease amiodarone to 200 mg bid, from 400 mg bid, for one more week and then decrease to 200 mg a day. Trough digoxin level drawn this am. Ringing in ears less but still present. Per pharmacist, she thought that Ranexa was the culprit. He can discuss d/cing drug with Dr. Johney Frame, on next appointment in early October. He is feeling good, no shortness of breath, weight stable.Marland Kitchen

## 2016-07-02 NOTE — Patient Instructions (Signed)
Your physician has recommended you make the following change in your medication:  1)Decrease Amiodarone 200mg  twice a day for one more week then decrease to 200mg  once a day

## 2016-07-04 ENCOUNTER — Other Ambulatory Visit (HOSPITAL_COMMUNITY): Payer: Self-pay | Admitting: *Deleted

## 2016-07-04 MED ORDER — AMIODARONE HCL 200 MG PO TABS: 200.0000 mg | ORAL_TABLET | Freq: Two times a day (BID) | ORAL | 3 refills | Status: DC

## 2016-07-13 ENCOUNTER — Telehealth: Payer: Self-pay | Admitting: Internal Medicine

## 2016-07-13 NOTE — Telephone Encounter (Signed)
New Message   1. Are you calling in reference to your FMLA or disability form? No  2. What is your question in regards to FMLA or disability form? N/A   3. Do you need copies of your medical records? No  4. Are you waiting on a nurse to call you back with results or are you wanting copies of your results? No   Drenda Freeze from [facility] voiced this pt enrolled in their case management program.  Drenda Freeze voiced they provide over the phone nurse support information.  Please f/u

## 2016-07-18 ENCOUNTER — Ambulatory Visit (HOSPITAL_COMMUNITY)
Admission: RE | Admit: 2016-07-18 | Discharge: 2016-07-18 | Disposition: A | Discharge status: Home / Self Care | Payer: BLUE CROSS/BLUE SHIELD | Source: Ambulatory Visit | Attending: Nurse Practitioner | Admitting: Nurse Practitioner

## 2016-07-18 ENCOUNTER — Encounter (HOSPITAL_COMMUNITY): Payer: Self-pay | Admitting: Nurse Practitioner

## 2016-07-18 VITALS — BP 138/96 | HR 73 | Ht 74.0 in | Wt 253.8 lb

## 2016-07-18 DIAGNOSIS — I428 Other cardiomyopathies: Secondary | ICD-10-CM | POA: Diagnosis not present

## 2016-07-18 DIAGNOSIS — I519 Heart disease, unspecified: Secondary | ICD-10-CM | POA: Diagnosis not present

## 2016-07-18 DIAGNOSIS — Z880 Allergy status to penicillin: Secondary | ICD-10-CM | POA: Insufficient documentation

## 2016-07-18 DIAGNOSIS — I4819 Other persistent atrial fibrillation: Secondary | ICD-10-CM

## 2016-07-18 DIAGNOSIS — Z7901 Long term (current) use of anticoagulants: Secondary | ICD-10-CM | POA: Diagnosis not present

## 2016-07-18 DIAGNOSIS — Z79899 Other long term (current) drug therapy: Secondary | ICD-10-CM | POA: Diagnosis not present

## 2016-07-18 DIAGNOSIS — I481 Persistent atrial fibrillation: Secondary | ICD-10-CM | POA: Diagnosis not present

## 2016-07-18 DIAGNOSIS — I10 Essential (primary) hypertension: Secondary | ICD-10-CM | POA: Insufficient documentation

## 2016-07-18 DIAGNOSIS — Z9889 Other specified postprocedural states: Secondary | ICD-10-CM | POA: Diagnosis not present

## 2016-07-18 DIAGNOSIS — I429 Cardiomyopathy, unspecified: Secondary | ICD-10-CM | POA: Insufficient documentation

## 2016-07-18 DIAGNOSIS — Z720 Tobacco use: Secondary | ICD-10-CM | POA: Insufficient documentation

## 2016-07-18 LAB — COMPREHENSIVE METABOLIC PANEL
ALT: 64 U/L — ABNORMAL HIGH (ref 17–63)
ANION GAP: 9 (ref 5–15)
AST: 35 U/L (ref 15–41)
Albumin: 4.2 g/dL (ref 3.5–5.0)
Alkaline Phosphatase: 80 U/L (ref 38–126)
BILIRUBIN TOTAL: 1 mg/dL (ref 0.3–1.2)
BUN: 23 mg/dL — AB (ref 6–20)
CALCIUM: 9.4 mg/dL (ref 8.9–10.3)
CO2: 31 mmol/L (ref 22–32)
Chloride: 98 mmol/L — ABNORMAL LOW (ref 101–111)
Creatinine, Ser: 1.29 mg/dL — ABNORMAL HIGH (ref 0.61–1.24)
GFR calc Af Amer: >60 mL/min (ref >60)
Glucose, Bld: 103 mg/dL — ABNORMAL HIGH (ref 65–99)
POTASSIUM: 4.4 mmol/L (ref 3.5–5.1)
Sodium: 138 mmol/L (ref 135–145)
TOTAL PROTEIN: 7 g/dL (ref 6.5–8.1)

## 2016-07-18 LAB — CBC
HCT: 45 % (ref 39.0–52.0)
Hemoglobin: 15.7 g/dL (ref 13.0–17.0)
MCH: 30.5 pg (ref 26.0–34.0)
MCHC: 34.9 g/dL (ref 30.0–36.0)
MCV: 87.4 fL (ref 78.0–100.0)
Platelets: 164 K/uL (ref 150–400)
RBC: 5.15 MIL/uL (ref 4.22–5.81)
RDW: 12.6 % (ref 11.5–15.5)
WBC: 8.4 K/uL (ref 4.0–10.5)

## 2016-07-18 LAB — DIGOXIN LEVEL: Digoxin Level: 0.7 ng/mL — ABNORMAL LOW (ref 0.8–2.0)

## 2016-07-18 LAB — TSH: TSH: 1.328 u[IU]/mL (ref 0.350–4.500)

## 2016-07-18 MED ORDER — LOSARTAN POTASSIUM 25 MG PO TABS: 50.0000 mg | ORAL_TABLET | Freq: Every day | ORAL | 3 refills | Status: DC

## 2016-07-18 MED ORDER — AMIODARONE HCL 200 MG PO TABS: 200.0000 mg | ORAL_TABLET | Freq: Every day | ORAL | 3 refills | Status: DC

## 2016-07-18 MED ORDER — CARVEDILOL 6.25 MG PO TABS: 6.2500 mg | ORAL_TABLET | Freq: Two times a day (BID) | ORAL | 3 refills | Status: DC

## 2016-07-18 MED ORDER — LOSARTAN POTASSIUM 50 MG PO TABS: 50.0000 mg | ORAL_TABLET | Freq: Every day | ORAL | 6 refills | Status: DC

## 2016-07-18 NOTE — Progress Notes (Signed)
Electrophysiology Office Note   Date:  07/18/2016   ID:  Russell Arnold, DOB 03-18-1961, MRN [id number]  Primary Electrophysiologist: Hillis Range, MD    CC: Afib   History of Present Illness: Russell Arnold is a 55 y.o. male who presents today for electrophysiology evaluation.   The patient has symptomatic persistent afib.  He was recently admitted with acute decompensated CHF in the setting of AF with RVR.  He was placed on amiodarone.  He is now in sinus rhythm.  He feels "much better".   He is concerned about long term amiodarone.   Today, he denies symptoms of palpitations, chest pain, shortness of breath, orthopnea, PND, lower extremity edema, claudication, dizziness, presyncope, syncope, bleeding, or neurologic sequela. The patient is tolerating medications without difficulties and is otherwise without complaint today.    Past Medical History:  Diagnosis Date  . A-fib (HCC)   . Hypertension    Past Surgical History:  Procedure Laterality Date  . APPENDECTOMY    . CARDIOVERSION N/A 06/14/2016   Procedure: CARDIOVERSION;  Surgeon: Peter M Swaziland, MD;  Location: [facility] ENDOSCOPY;  Service: Cardiovascular;  Laterality: N/A;  . CARDIOVERSION N/A 06/17/2016   Procedure: CARDIOVERSION;  Surgeon: Dolores Patty, MD;  Location: [facility] OR;  Service: Cardiovascular;  Laterality: N/A;     Current Outpatient Prescriptions  Medication Sig Dispense Refill  . albuterol (PROVENTIL HFA;VENTOLIN HFA) 108 (90 Base) MCG/ACT inhaler Inhale 1 puff into the lungs every 6 (six) hours as needed for wheezing or shortness of breath.    Marland Kitchen amiodarone (PACERONE) 200 MG tablet Take 1 tablet (200 mg total) by mouth daily. 30 tablet 3  . carvedilol (COREG) 6.25 MG tablet Take 1 tablet (6.25 mg total) by mouth 2 (two) times daily with a meal. 60 tablet 3  . cholecalciferol (VITAMIN D) 1000 units tablet Take 1,000 Units by mouth daily.    . digoxin (LANOXIN) 0.125 MG tablet Take 1 tablet (0.125 mg total) by  mouth daily. 30 tablet 5  . losartan (COZAAR) 50 MG tablet Take 1 tablet (50 mg total) by mouth daily. 30 tablet 6  . ranolazine (RANEXA) 500 MG 12 hr tablet Take 1 tablet (500 mg total) by mouth 2 (two) times daily. 60 tablet 3  . rivaroxaban (XARELTO) 20 MG TABS tablet Take 1 tablet (20 mg total) by mouth daily with supper. 30 tablet 6  . spironolactone (ALDACTONE) 25 MG tablet Take 25 mg by mouth daily.    Marland Kitchen torsemide (DEMADEX) 20 MG tablet Take 2 tablets (40 mg total) by mouth daily. 30 tablet 5   No current facility-administered medications for this encounter.     Allergies:   Amoxicillin   Social History:  The patient  reports that he has never smoked. He has quit using smokeless tobacco. His smokeless tobacco use included Chew. He reports that he does not drink alcohol or use drugs.   Family History:  The patient's  family history includes Heart failure in his mother.    ROS:  Please see the history of present illness.   All other systems are reviewed and negative.    PHYSICAL EXAM: VS:  BP (!) 138/96 (BP Location: Left Arm, Patient Position: Sitting, Cuff Size: Normal)   Pulse 73   Ht 6\' 2"  (1.88 m)   Wt 253 lb 12.8 oz (115.1 kg)   BMI 32.59 kg/m  , BMI Body mass index is 32.59 kg/m. GEN: Well nourished, well developed, in no acute distress  HEENT: normal  Neck: no JVD, carotid bruits, or masses Cardiac: RRR; no murmurs, rubs, or gallops,no edema  Respiratory:  clear to auscultation bilaterally, normal work of breathing GI: soft, nontender, nondistended, + BS MS: no deformity or atrophy  Skin: warm and dry  Neuro:  Strength and sensation are intact Psych: euthymic mood, full affect  EKG:  EKG is ordered today. The ekg ordered today shows sinus rhythm 73 bpm, PR 164 msec, Qtc 440 msec, LAD   Recent Labs: 06/13/2016: B Natriuretic Peptide 484.2; Magnesium 2.2 07/18/2016: ALT 64; BUN 23; Creatinine, Ser 1.29; Hemoglobin 15.7; Platelets 164; Potassium 4.4; Sodium 138;  TSH 1.328    Lipid Panel     Component Value Date/Time   CHOL 124 06/14/2016 0305   TRIG 48 06/14/2016 0305   HDL 54 06/14/2016 0305   CHOLHDL 2.3 06/14/2016 0305   VLDL 10 06/14/2016 0305   LDLCALC 60 06/14/2016 0305     Wt Readings from Last 3 Encounters:  07/18/16 253 lb 12.8 oz (115.1 kg)  06/21/16 257 lb 9.6 oz (116.8 kg)  06/18/16 258 lb 6.4 oz (117.2 kg)      Other studies Reviewed: Additional studies/ records that were reviewed today include: hospital records, prior echo  Review of the above records today demonstrates: EF 10-15%   ASSESSMENT AND PLAN:  1.  Persistent afib The patient is now in sinus.  He and I agree that long term risks of amiodarone are high. Therapeutic strategies for afib including medicine and ablation were discussed in detail with the patient today. Risk, benefits, and alternatives to EP study and radiofrequency ablation for afib were also discussed in detail today. These risks include but are not limited to stroke, bleeding, vascular damage, tamponade, perforation, damage to the esophagus, lungs, and other structures, pulmonary vein stenosis, worsening renal function, and death. The patient understands these risk and wishes to proceed.  We will therefore proceed with catheter ablation at the next available time.  Will plan TEE prior to ablation. Importance of compliance with anticoagulation was discussed today. Tfts, lfts, cbc today  2. Nonischemic CM Likely due to AF with RVR CASTLE AF study results discussed with patient. I think that he will do better with ablation long term. Increase coreg to 6.25mg  BID today Increase losartan to 50mg  daily today Bmet, dig level today Reassess EF with TEE  Hopefully EF will continue to recover   Current medicines are reviewed at length with the patient today.   The patient does not have concerns regarding his medicines.  The following changes were made today:  none  Labs/ tests ordered today include:   Orders Placed This Encounter  Procedures  . Digoxin level  . Comprehensive metabolic panel  . TSH  . CBC  . EKG 12-Lead     Signed, Hillis RangeJames Tabor Bartram, MD  07/18/2016 4:52 PM     [facility] [address] [place] [id number] [phone] (office) [phone] (fax)

## 2016-07-18 NOTE — Patient Instructions (Signed)
Your physician has recommended you make the following change in your medication:  1)Increase coreg to 6.25mg  twice a day 2)Increase losartan to 50mg  daily  Russell Arnold will be in touch with you regarding ablation scheduling.

## 2016-07-20 ENCOUNTER — Other Ambulatory Visit (HOSPITAL_COMMUNITY): Payer: Self-pay | Admitting: *Deleted

## 2016-07-20 DIAGNOSIS — I4819 Other persistent atrial fibrillation: Secondary | ICD-10-CM

## 2016-07-23 ENCOUNTER — Other Ambulatory Visit (HOSPITAL_COMMUNITY): Payer: Self-pay | Admitting: *Deleted

## 2016-07-23 MED ORDER — SPIRONOLACTONE 25 MG PO TABS: 25.0000 mg | ORAL_TABLET | Freq: Every day | ORAL | 3 refills | Status: DC

## 2016-08-02 ENCOUNTER — Ambulatory Visit (HOSPITAL_BASED_OUTPATIENT_CLINIC_OR_DEPARTMENT_OTHER): Payer: BLUE CROSS/BLUE SHIELD | Attending: Nurse Practitioner | Admitting: Cardiology

## 2016-08-02 DIAGNOSIS — R0683 Snoring: Secondary | ICD-10-CM | POA: Diagnosis not present

## 2016-08-02 DIAGNOSIS — G4733 Obstructive sleep apnea (adult) (pediatric): Secondary | ICD-10-CM | POA: Diagnosis not present

## 2016-08-02 DIAGNOSIS — I4891 Unspecified atrial fibrillation: Secondary | ICD-10-CM | POA: Diagnosis not present

## 2016-08-05 NOTE — Procedures (Signed)
   Patient Name: Asha, Sears Date: 08/02/2016 Gender: Male D.O.B: 1961-03-25 Age (years): 54 Referring Provider: Newman Nip Height (inches): 74 Interpreting Physician: Armanda Magic MD, ABSM Weight (lbs): 250 RPSGT: Shelah Lewandowsky BMI: 32 MRN: [id number] Neck Size: 16.00  CLINICAL INFORMATION Sleep Study Type: NPSG Indication for sleep study: OSA Epworth Sleepiness Score: 1  SLEEP STUDY TECHNIQUE As per the AASM Manual for the Scoring of Sleep and Associated Events v2.3 (April 2016) with a hypopnea requiring 4% desaturations. The channels recorded and monitored were frontal, central and occipital EEG, electrooculogram (EOG), submentalis EMG (chin), nasal and oral airflow, thoracic and abdominal wall motion, anterior tibialis EMG, snore microphone, electrocardiogram, and pulse oximetry.  MEDICATIONS Medications self-administered by patient taken the night of the study : N/A  SLEEP ARCHITECTURE The study was initiated at 10:28:30 PM and ended at 5:15:48 AM. Sleep onset time was 43.7 minutes and the sleep efficiency was 60.3%. The total sleep time was 245.5 minutes. Stage REM latency was 63.0 minutes. The patient spent 12.02% of the night in stage N1 sleep, 67.62% in stage N2 sleep, 0.00% in stage N3 and 20.37% in REM. Alpha intrusion was absent. Supine sleep was 23.63%.  RESPIRATORY PARAMETERS The overall apnea/hypopnea index (AHI) was 0.5 per hour. There were 0 total apneas, including 0 obstructive, 0 central and 0 mixed apneas. There were 2 hypopneas and 17 RERAs. The AHI during Stage REM sleep was 0.0 per hour. AHI while supine was 2.1 per hour. The mean oxygen saturation was 94.66%. The minimum SpO2 during sleep was 91.00%. Moderate snoring was noted during this study.  CARDIAC DATA The 2 lead EKG demonstrated sinus rhythm. The mean heart rate was 51.65 beats per minute. Other EKG findings include: None.  LEG MOVEMENT DATA The total PLMS were 20 with  a resulting PLMS index of 4.89. Associated arousal with leg movement index was 0.0 .  IMPRESSIONS - No significant obstructive sleep apnea occurred during this study (AHI = 0.5/h). - No significant central sleep apnea occurred during this study (CAI = 0.0/h). - The patient had minimal or no oxygen desaturation during the study (Min O2 = 91.00%) - The patient snored with Moderate snoring volume. - No cardiac abnormalities were noted during this study. - Clinically significant periodic limb movements did not occur during sleep. No significant associated arousals. - Reduced sleep efficiency with increased frequency of arousals.    DIAGNOSIS - Normal study  RECOMMENDATIONS - Avoid alcohol, sedatives and other CNS depressants that may worsen sleep apnea and disrupt normal sleep architecture. - Sleep hygiene should be reviewed to assess factors that may improve sleep quality. - Weight management and regular exercise should be initiated or continued if appropriate.   Armanda Magic Diplomate, American Board of Sleep Medicine  ELECTRONICALLY SIGNED ON:  08/05/2016, 6:31 PM [facility] PH: [phone]   FX: [phone] ACCREDITED BY THE AMERICAN ACADEMY OF SLEEP MEDICINE

## 2016-08-06 ENCOUNTER — Telehealth (HOSPITAL_COMMUNITY): Payer: Self-pay | Admitting: *Deleted

## 2016-08-06 NOTE — Telephone Encounter (Signed)
TEE preauth  [id number]    Request Status:  Authorized  Health Plan[place] [employer]   Valid Dates:  08/06/2016 - 09/04/2016      Scheduled Date of Service:  09/03/2016

## 2016-08-07 NOTE — Progress Notes (Signed)
08/07/16  Left message to call back for sleep study results at [phone]

## 2016-08-10 ENCOUNTER — Telehealth: Payer: Self-pay | Admitting: Internal Medicine

## 2016-08-10 NOTE — Progress Notes (Signed)
Patient has been informed of results. Stated verbal understanding.  

## 2016-08-10 NOTE — Telephone Encounter (Signed)
Follow Up:; ° ° °Returning your call. °

## 2016-08-10 NOTE — Progress Notes (Signed)
08/10/16 Left message for patient to call back for results

## 2016-08-10 NOTE — Telephone Encounter (Signed)
Documentation done in another phone note.

## 2016-08-12 ENCOUNTER — Encounter (HOSPITAL_BASED_OUTPATIENT_CLINIC_OR_DEPARTMENT_OTHER): Payer: BLUE CROSS/BLUE SHIELD

## 2016-08-28 ENCOUNTER — Ambulatory Visit (HOSPITAL_COMMUNITY)
Admission: RE | Admit: 2016-08-28 | Discharge: 2016-08-28 | Disposition: A | Discharge status: Home / Self Care | Payer: BLUE CROSS/BLUE SHIELD | Source: Ambulatory Visit | Attending: Nurse Practitioner | Admitting: Nurse Practitioner

## 2016-08-28 ENCOUNTER — Encounter (HOSPITAL_COMMUNITY): Payer: Self-pay | Admitting: Nurse Practitioner

## 2016-08-28 VITALS — BP 120/84 | HR 69 | Ht 74.0 in | Wt 263.2 lb

## 2016-08-28 DIAGNOSIS — I509 Heart failure, unspecified: Secondary | ICD-10-CM | POA: Insufficient documentation

## 2016-08-28 DIAGNOSIS — Z7901 Long term (current) use of anticoagulants: Secondary | ICD-10-CM | POA: Diagnosis not present

## 2016-08-28 DIAGNOSIS — I4891 Unspecified atrial fibrillation: Secondary | ICD-10-CM | POA: Diagnosis present

## 2016-08-28 DIAGNOSIS — Z79899 Other long term (current) drug therapy: Secondary | ICD-10-CM | POA: Insufficient documentation

## 2016-08-28 DIAGNOSIS — I4819 Other persistent atrial fibrillation: Secondary | ICD-10-CM

## 2016-08-28 DIAGNOSIS — I481 Persistent atrial fibrillation: Secondary | ICD-10-CM | POA: Insufficient documentation

## 2016-08-28 LAB — CBC WITH DIFFERENTIAL/PLATELET
BASOS ABS: 0 10*3/uL (ref 0.0–0.1)
Basophils Relative: 1 %
Eosinophils Absolute: 0.2 10*3/uL (ref 0.0–0.7)
Eosinophils Relative: 3 %
HEMATOCRIT: 42 % (ref 39.0–52.0)
Hemoglobin: 14.8 g/dL (ref 13.0–17.0)
LYMPHS ABS: 1.1 10*3/uL (ref 0.7–4.0)
LYMPHS PCT: 23 %
MCH: 30.9 pg (ref 26.0–34.0)
MCHC: 35.2 g/dL (ref 30.0–36.0)
MCV: 87.7 fL (ref 78.0–100.0)
MONO ABS: 0.3 10*3/uL (ref 0.1–1.0)
Monocytes Relative: 6 %
NEUTROS ABS: 3.4 10*3/uL (ref 1.7–7.7)
Neutrophils Relative %: 67 %
Platelets: 164 10*3/uL (ref 150–400)
RBC: 4.79 MIL/uL (ref 4.22–5.81)
RDW: 15.2 % (ref 11.5–15.5)
WBC: 5 10*3/uL (ref 4.0–10.5)

## 2016-08-28 LAB — BASIC METABOLIC PANEL
ANION GAP: 6 (ref 5–15)
BUN: 20 mg/dL (ref 6–20)
CHLORIDE: 102 mmol/L (ref 101–111)
CO2: 30 mmol/L (ref 22–32)
Calcium: 9.1 mg/dL (ref 8.9–10.3)
Creatinine, Ser: 1.33 mg/dL — ABNORMAL HIGH (ref 0.61–1.24)
GFR calc Af Amer: >60 mL/min (ref >60)
GFR, EST NON AFRICAN AMERICAN: 59 mL/min — AB (ref >60)
GLUCOSE: 174 mg/dL — AB (ref 65–99)
POTASSIUM: 3.7 mmol/L (ref 3.5–5.1)
Sodium: 138 mmol/L (ref 135–145)

## 2016-08-28 NOTE — Progress Notes (Addendum)
   Primary Care Physician: No primary care provider on file. Referring Physician: Dr. Allred, [facility] f/u   Russell Arnold is a 54 y.o. male with a h/o persistent  afib  that was newly diagnosed around 4 months ago, with hard to control v rates. He and his wife were vacationing in [place] and he started having issues with heart failure symptoms. He was admitted with progressive SOB on 8/30 and was in afib with RVR. He was found to have reduced EF at 10-15% thought to be TMC. Cardizem was discontinued and started on IV amiodarone and cardioverted to SR. He was started on Xarelto  for stroke prevention.  He returned 9/7 to the afib clinic  and felt well but unfortunately had returned to afib with v rate of 148 bpm. He did not feel the fast heart beat.His weight is stable down one pound from d/c at at 247 bpm. He continues loading on  amiodarone 200 mg bid. Continues on xarelto. He had a sleep study 1-/19 and did not show any significant sleep apnea.   He was seen by Russell Arnold 10/4 and it was decided that pt would pursue ablation. He is here for labs and to answer any questions re procedure. He had a few questions which were answered but remembers the risk vrs benefit of the procedure as explained by Russell Arnold. He is clear on his instructions regarding the procedure.  Today, he denies symptoms of palpitations, chest pain, shortness of breath, orthopnea, PND, lower extremity edema, dizziness, presyncope, syncope, or neurologic sequela. The patient is tolerating medications without difficulties and is otherwise without complaint today.   Past Medical History:  Diagnosis Date  . A-fib (HCC)   . Hypertension    Past Surgical History:  Procedure Laterality Date  . APPENDECTOMY    . CARDIOVERSION N/A 06/14/2016   Procedure: CARDIOVERSION;  Surgeon: Russell M Jordan, MD;  Location: [facility];  Service: Cardiovascular;  Laterality: N/A;  . CARDIOVERSION N/A 06/17/2016   Procedure: CARDIOVERSION;  Surgeon:  Russell R Bensimhon, MD;  Location: [facility] OR;  Service: Cardiovascular;  Laterality: N/A;    Current Outpatient Prescriptions  Medication Sig Dispense Refill  . albuterol (PROVENTIL HFA;VENTOLIN HFA) 108 (90 Base) MCG/ACT inhaler Inhale 1 puff into the lungs every 6 (six) hours as needed for wheezing or shortness of breath.    . amiodarone (PACERONE) 200 MG tablet Take 1 tablet (200 mg total) by mouth daily. 30 tablet 3  . carvedilol (COREG) 6.25 MG tablet Take 1 tablet (6.25 mg total) by mouth 2 (two) times daily with a meal. 60 tablet 3  . cholecalciferol (VITAMIN D) 1000 units tablet Take 1,000 Units by mouth daily.    . digoxin (LANOXIN) 0.125 MG tablet Take 1 tablet (0.125 mg total) by mouth daily. 30 tablet 5  . losartan (COZAAR) 50 MG tablet Take 1 tablet (50 mg total) by mouth daily. 30 tablet 6  . ranolazine (RANEXA) 500 MG 12 hr tablet Take 1 tablet (500 mg total) by mouth 2 (two) times daily. 60 tablet 3  . rivaroxaban (XARELTO) 20 MG TABS tablet Take 1 tablet (20 mg total) by mouth daily with supper. 30 tablet 6  . spironolactone (ALDACTONE) 25 MG tablet Take 1 tablet (25 mg total) by mouth daily. 30 tablet 3  . torsemide (DEMADEX) 20 MG tablet Take 2 tablets (40 mg total) by mouth daily. 30 tablet 5  . vitamin C (ASCORBIC ACID) 500 MG tablet Take 500 mg by mouth   daily.     No current facility-administered medications for this encounter.     Allergies  Allergen Reactions  . Amoxicillin Hives    Hives - noted "red splotches"    Social History   Social History  . Marital status: Married    Spouse name: N/A  . Number of children: N/A  . Years of education: N/A   Occupational History  . Company secretary    Social History Main Topics  . Smoking status: Never Smoker  . Smokeless tobacco: Former Neurosurgeon    Types: Chew  . Alcohol use No  . Drug use: No  . Sexual activity: Not on file   Other Topics Concern  . Not on file   Social History Narrative  . No narrative on file      Family History  Problem Relation Age of Onset  . Heart failure Mother     ROS- All systems are reviewed and negative except as per the HPI above  Physical Exam: Vitals:   08/28/16 0847  BP: 120/84  Pulse: 69  Weight: 263 lb 3.2 oz (119.4 kg)  Height: 6\' 2"  (1.88 m)    GEN- The patient is well appearing, alert and oriented x 3 today.   Head- normocephalic, atraumatic Eyes-  Sclera clear, conjunctiva pink Ears- hearing intact Oropharynx- clear Neck- supple, no JVP Lymph- no cervical lymphadenopathy Lungs- Clear to ausculation bilaterally, normal work of breathing Heart- regular rate and rhythm, no murmurs, rubs or gallops, PMI not laterally displaced GI- soft, NT, ND, + BS Extremities- no clubbing, cyanosis, or edema MS- no significant deformity or atrophy Skin- no rash or lesion Psych- euthymic mood, full affect Neuro- strength and sensation are intact  EKG-Sinus rhythm at 69 bpm, pr int 180 ms, qrs int `08 ms, qtc 447 ms Epic records reviewed  Assessment and Plan:  1. Persistent afib with RVR and TMC Has been maintaining SR with amiodarone Pending ablation 11/21 Amiodarone will probably be d/ce'd at some point after ablation to avoid long term complications Continue xarelto, states no missed doese Continue coreg  Bmet/cbc today  2. CHF Continue ranexa 500 mg bid  Fluid status stable Continue spironolactone Continue torsemide  Daily weights  Avoid salt Hopefully maintaining SR EF will have improved Pending TEE on Monday prior to ablation  Pending f/u with Dr. Johney Arnold 2/23  Russell Arnold. [facility] [address] [place], [place] [address] [phone]

## 2016-09-03 ENCOUNTER — Ambulatory Visit (HOSPITAL_COMMUNITY)
Admission: RE | Admit: 2016-09-03 | Discharge: 2016-09-03 | Disposition: A | Discharge status: Home / Self Care | Payer: BLUE CROSS/BLUE SHIELD | Source: Ambulatory Visit | Attending: Cardiovascular Disease | Admitting: Cardiovascular Disease

## 2016-09-03 ENCOUNTER — Ambulatory Visit (HOSPITAL_BASED_OUTPATIENT_CLINIC_OR_DEPARTMENT_OTHER): Payer: BLUE CROSS/BLUE SHIELD

## 2016-09-03 ENCOUNTER — Encounter (HOSPITAL_COMMUNITY)
Admission: RE | Disposition: A | Discharge status: Home / Self Care | Payer: Self-pay | Source: Ambulatory Visit | Attending: Cardiovascular Disease

## 2016-09-03 ENCOUNTER — Encounter (HOSPITAL_COMMUNITY): Payer: Self-pay

## 2016-09-03 DIAGNOSIS — I4891 Unspecified atrial fibrillation: Secondary | ICD-10-CM

## 2016-09-03 DIAGNOSIS — Z7901 Long term (current) use of anticoagulants: Secondary | ICD-10-CM | POA: Diagnosis not present

## 2016-09-03 DIAGNOSIS — Z880 Allergy status to penicillin: Secondary | ICD-10-CM | POA: Insufficient documentation

## 2016-09-03 DIAGNOSIS — I110 Hypertensive heart disease with heart failure: Secondary | ICD-10-CM | POA: Insufficient documentation

## 2016-09-03 DIAGNOSIS — I509 Heart failure, unspecified: Secondary | ICD-10-CM | POA: Diagnosis not present

## 2016-09-03 DIAGNOSIS — I4819 Other persistent atrial fibrillation: Secondary | ICD-10-CM

## 2016-09-03 DIAGNOSIS — I481 Persistent atrial fibrillation: Secondary | ICD-10-CM | POA: Insufficient documentation

## 2016-09-03 HISTORY — PX: TEE WITHOUT CARDIOVERSION: SHX5443

## 2016-09-03 SURGERY — ECHOCARDIOGRAM, TRANSESOPHAGEAL
Anesthesia: Moderate Sedation

## 2016-09-03 MED ORDER — BUTAMBEN-TETRACAINE-BENZOCAINE 2-2-14 % EX AERO
INHALATION_SPRAY | CUTANEOUS | Status: DC | PRN
  Administered 2016-09-03: 2 via TOPICAL

## 2016-09-03 MED ORDER — MIDAZOLAM HCL 5 MG/ML IJ SOLN
INTRAMUSCULAR | Status: AC
  Filled 2016-09-03: qty 2

## 2016-09-03 MED ORDER — MIDAZOLAM HCL 10 MG/2ML IJ SOLN
INTRAMUSCULAR | Status: DC | PRN
  Administered 2016-09-03: 2 mg via INTRAVENOUS
  Administered 2016-09-03: 1 mg via INTRAVENOUS
  Administered 2016-09-03: 2 mg via INTRAVENOUS

## 2016-09-03 MED ORDER — SODIUM CHLORIDE 0.9 % IV SOLN
INTRAVENOUS | Status: DC
  Administered 2016-09-03: 08:00:00 via INTRAVENOUS

## 2016-09-03 MED ORDER — FENTANYL CITRATE (PF) 100 MCG/2ML IJ SOLN
INTRAMUSCULAR | Status: DC | PRN
Start: 2016-09-03 — End: 2016-09-03
  Administered 2016-09-03 (×2): 25 ug via INTRAVENOUS

## 2016-09-03 MED ORDER — DIPHENHYDRAMINE HCL 50 MG/ML IJ SOLN
INTRAMUSCULAR | Status: AC
  Filled 2016-09-03: qty 1

## 2016-09-03 MED ORDER — DIPHENHYDRAMINE HCL 50 MG/ML IJ SOLN
INTRAMUSCULAR | Status: DC | PRN
  Administered 2016-09-03: 25 mg via INTRAVENOUS

## 2016-09-03 MED ORDER — FENTANYL CITRATE (PF) 100 MCG/2ML IJ SOLN
INTRAMUSCULAR | Status: AC
  Filled 2016-09-03: qty 4

## 2016-09-03 NOTE — Interval H&P Note (Signed)
History and Physical Interval Note:  09/03/2016 7:53 AM  Russell Arnold  has presented today for surgery, with the diagnosis of AFIB  The various methods of treatment have been discussed with the patient and family. After consideration of risks, benefits and other options for treatment, the patient has consented to  Procedure(s): TRANSESOPHAGEAL ECHOCARDIOGRAM (TEE) (N/A) as a surgical intervention .  The patient's history has been reviewed, patient examined, no change in status, stable for surgery.  I have reviewed the patient's chart and labs.  Questions were answered to the patient's satisfaction.     [name]

## 2016-09-03 NOTE — H&P (View-Only) (Signed)
Primary Care Physician: No primary care provider on file. Referring Physician: Dr. Johney FrameAllred, [facility]   Russell Arnold is a 55 y.o. male with a h/o persistent  afib  that was newly diagnosed around 4 months ago, with hard to control v rates. He and his wife were vacationing in [place] and he started having issues with heart failure symptoms. He was admitted with progressive SOB on 8/30 and was in afib with RVR. He was found to have reduced EF at 10-15% thought to be TMC. Cardizem was discontinued and started on IV amiodarone and cardioverted to SR. He was started on Xarelto  for stroke prevention.  He returned 9/7 to the afib clinic  and felt well but unfortunately had returned to afib with v rate of 148 bpm. He did not feel the fast heart beat.His weight is stable down one pound from d/c at at 247 bpm. He continues loading on  amiodarone 200 mg bid. Continues on xarelto. He had a sleep study 1-/19 and did not show any significant sleep apnea.   He was seen by Dr. Sanjuan DameAlled 10/4 and it was decided that pt would pursue ablation. He is here for labs and to answer any questions re procedure. He had a few questions which were answered but remembers the risk vrs benefit of the procedure as explained by Dr. Johney FrameAllred. He is clear on his instructions regarding the procedure.  Today, he denies symptoms of palpitations, chest pain, shortness of breath, orthopnea, PND, lower extremity edema, dizziness, presyncope, syncope, or neurologic sequela. The patient is tolerating medications without difficulties and is otherwise without complaint today.   Past Medical History:  Diagnosis Date  . A-fib (HCC)   . Hypertension    Past Surgical History:  Procedure Laterality Date  . APPENDECTOMY    . CARDIOVERSION N/A 06/14/2016   Procedure: CARDIOVERSION;  Surgeon: Peter M SwazilandJordan, MD;  [facility];  Service: Cardiovascular;  Laterality: N/A;  . CARDIOVERSION N/A 06/17/2016   Procedure: CARDIOVERSION;  Surgeon:  Dolores Pattyaniel R Bensimhon, MD;  [facility];  Service: Cardiovascular;  Laterality: N/A;    Current Outpatient Prescriptions  Medication Sig Dispense Refill  . albuterol (PROVENTIL HFA;VENTOLIN HFA) 108 (90 Base) MCG/ACT inhaler Inhale 1 puff into the lungs every 6 (six) hours as needed for wheezing or shortness of breath.    Marland Kitchen. amiodarone (PACERONE) 200 MG tablet Take 1 tablet (200 mg total) by mouth daily. 30 tablet 3  . carvedilol (COREG) 6.25 MG tablet Take 1 tablet (6.25 mg total) by mouth 2 (two) times daily with a meal. 60 tablet 3  . cholecalciferol (VITAMIN D) 1000 units tablet Take 1,000 Units by mouth daily.    . digoxin (LANOXIN) 0.125 MG tablet Take 1 tablet (0.125 mg total) by mouth daily. 30 tablet 5  . losartan (COZAAR) 50 MG tablet Take 1 tablet (50 mg total) by mouth daily. 30 tablet 6  . ranolazine (RANEXA) 500 MG 12 hr tablet Take 1 tablet (500 mg total) by mouth 2 (two) times daily. 60 tablet 3  . rivaroxaban (XARELTO) 20 MG TABS tablet Take 1 tablet (20 mg total) by mouth daily with supper. 30 tablet 6  . spironolactone (ALDACTONE) 25 MG tablet Take 1 tablet (25 mg total) by mouth daily. 30 tablet 3  . torsemide (DEMADEX) 20 MG tablet Take 2 tablets (40 mg total) by mouth daily. 30 tablet 5  . vitamin C (ASCORBIC ACID) 500 MG tablet Take 500 mg by mouth  daily.     No current facility-administered medications for this encounter.     Allergies  Allergen Reactions  . Amoxicillin Hives    Hives - noted "red splotches"    Social History   Social History  . Marital status: Married    Spouse name: N/A  . Number of children: N/A  . Years of education: N/A   Occupational History  . Company secretary    Social History Main Topics  . Smoking status: Never Smoker  . Smokeless tobacco: Former Neurosurgeon    Types: Chew  . Alcohol use No  . Drug use: No  . Sexual activity: Not on file   Other Topics Concern  . Not on file   Social History Narrative  . No narrative on file      Family History  Problem Relation Age of Onset  . Heart failure Mother     ROS- All systems are reviewed and negative except as per the HPI above  Physical Exam: Vitals:   08/28/16 0847  BP: 120/84  Pulse: 69  Weight: 263 lb 3.2 oz (119.4 kg)  Height: 6\' 2"  (1.88 m)    GEN- The patient is well appearing, alert and oriented x 3 today.   Head- normocephalic, atraumatic Eyes-  Sclera clear, conjunctiva pink Ears- hearing intact Oropharynx- clear Neck- supple, no JVP Lymph- no cervical lymphadenopathy Lungs- Clear to ausculation bilaterally, normal work of breathing Heart- regular rate and rhythm, no murmurs, rubs or gallops, PMI not laterally displaced GI- soft, NT, ND, + BS Extremities- no clubbing, cyanosis, or edema MS- no significant deformity or atrophy Skin- no rash or lesion Psych- euthymic mood, full affect Neuro- strength and sensation are intact  EKG-Sinus rhythm at 69 bpm, pr int 180 ms, qrs int `08 ms, qtc 447 ms Epic records reviewed  Assessment and Plan:  1. Persistent afib with RVR and TMC Has been maintaining SR with amiodarone Pending ablation 11/21 Amiodarone will probably be d/ce'd at some point after ablation to avoid long term complications Continue xarelto, states no missed doese Continue coreg  Bmet/cbc today  2. CHF Continue ranexa 500 mg bid  Fluid status stable Continue spironolactone Continue torsemide  Daily weights  Avoid salt Hopefully maintaining SR EF will have improved Pending TEE on Monday prior to ablation  Pending f/u with Dr. Johney Frame 2/23  Russell Arnold. [facility] [address] [place], [place] [address] [phone]

## 2016-09-03 NOTE — Op Note (Signed)
INDICATIONS: atrial fibrillation pre-ablation  PROCEDURE:   Informed consent was obtained prior to the procedure. The risks, benefits and alternatives for the procedure were discussed and the patient comprehended these risks.  Risks include, but are not limited to, cough, sore throat, vomiting, nausea, somnolence, esophageal and stomach trauma or perforation, bleeding, low blood pressure, aspiration, pneumonia, infection, trauma to the teeth and death.    After a procedural time-out, the oropharynx was anesthetized with 20% benzocaine spray.   During this procedure the patient was administered a total of Versed 5 mg and Fentanyl 50 mcg to achieve and maintain moderate conscious sedation.  The patient's heart rate, blood pressure, and oxygen saturationweare monitored continuously during the procedure. The period of conscious sedation was 13 minutes, of which I was present face-to-face 100% of this time.  The transesophageal probe was inserted in the esophagus and stomach without difficulty and multiple views were obtained.  The patient was kept under observation until the patient left the procedure room.  The patient left the procedure room in stable condition.   Agitated microbubble saline contrast was not administered.  COMPLICATIONS:    There were no immediate complications.  FINDINGS:  Normal TEE. No LA thrombus. EF is now 55%. Small left atrium.  RECOMMENDATIONS:     Proceed with RF ablation  Time Spent Directly with the Patient:  30 minutes   Jovanna Hodges 09/03/2016, 8:32 AM

## 2016-09-03 NOTE — Progress Notes (Signed)
  Echocardiogram Echocardiogram Transesophageal has been performed.  Russell Arnold 09/03/2016, 8:59 AM

## 2016-09-03 NOTE — Discharge Instructions (Signed)
TEE ° °YOU HAD AN CARDIAC PROCEDURE TODAY: Refer to the procedure report and other information in the discharge instructions given to you for any specific questions about what was found during the examination. If this information does not answer your questions, please call [facility] office at [phone] to clarify.  ° °DIET: Your first meal following the procedure should be a light meal and then it is ok to progress to your normal diet. A half-sandwich or bowl of soup is an example of a good first meal. Heavy or fried foods are harder to digest and may make you feel nauseous or bloated. Drink plenty of fluids but you should avoid alcoholic beverages for 24 hours. If you had a esophageal dilation, please see attached instructions for diet.  ° °ACTIVITY: Your care partner should take you home directly after the procedure. You should plan to take it easy, moving slowly for the rest of the day. You can resume normal activity the day after the procedure however YOU SHOULD NOT DRIVE, use power tools, machinery or perform tasks that involve climbing or major physical exertion for 24 hours (because of the sedation medicines used during the test).  ° °SYMPTOMS TO REPORT IMMEDIATELY: °A cardiologist can be reached at any hour. Please call [phone] for any of the following symptoms:  °Vomiting of blood or coffee ground material  °New, significant abdominal pain  °New, significant chest pain or pain under the shoulder blades  °Painful or persistently difficult swallowing  °New shortness of breath  °Black, tarry-looking or red, bloody stools ° °FOLLOW UP:  °Please also call with any specific questions about appointments or follow up tests. ° ° °Moderate Conscious Sedation, Adult, Care After °These instructions provide you with information about caring for yourself after your procedure. Your health care provider may also give you more specific instructions. Your treatment has been planned according to current medical  practices, but problems sometimes occur. Call your health care provider if you have any problems or questions after your procedure. °What can I expect after the procedure? °After your procedure, it is common: °· To feel sleepy for several hours. °· To feel clumsy and have poor balance for several hours. °· To have poor judgment for several hours. °· To vomit if you eat too soon. °Follow these instructions at home: °For at least 24 hours after the procedure:  °· Do not: °¨ Participate in activities where you could fall or become injured. °¨ Drive. °¨ Use heavy machinery. °¨ Drink alcohol. °¨ Take sleeping pills or medicines that cause drowsiness. °¨ Make important decisions or sign legal documents. °¨ Take care of children on your own. °· Rest. °Eating and drinking °· Follow the diet recommended by your health care provider. °· If you vomit: °¨ Drink water, juice, or soup when you can drink without vomiting. °¨ Make sure you have little or no nausea before eating solid foods. °General instructions °· Have a responsible adult stay with you until you are awake and alert. °· Take over-the-counter and prescription medicines only as told by your health care provider. °· If you smoke, do not smoke without supervision. °· Keep all follow-up visits as told by your health care provider. This is important. °Contact a health care provider if: °· You keep feeling nauseous or you keep vomiting. °· You feel light-headed. °· You develop a rash. °· You have a fever. °Get help right away if: °· You have trouble breathing. °This information is not intended to replace advice given   to you by your health care provider. Make sure you discuss any questions you have with your health care provider. °Document Released: 07/22/2013 Document Revised: 03/05/2016 Document Reviewed: 01/21/2016 °Elsevier Interactive Patient Education © 2017 [employer] ° °

## 2016-09-04 ENCOUNTER — Ambulatory Visit (HOSPITAL_COMMUNITY): Payer: BLUE CROSS/BLUE SHIELD | Admitting: Certified Registered"

## 2016-09-04 ENCOUNTER — Ambulatory Visit (HOSPITAL_COMMUNITY)
Admission: RE | Admit: 2016-09-04 | Discharge: 2016-09-04 | Disposition: A | Discharge status: Home / Self Care | Payer: BLUE CROSS/BLUE SHIELD | Source: Ambulatory Visit | Attending: Internal Medicine | Admitting: Internal Medicine

## 2016-09-04 ENCOUNTER — Encounter (HOSPITAL_COMMUNITY): Payer: Self-pay | Admitting: Internal Medicine

## 2016-09-04 ENCOUNTER — Encounter (HOSPITAL_COMMUNITY)
Admission: RE | Disposition: A | Discharge status: Home / Self Care | Payer: Self-pay | Source: Ambulatory Visit | Attending: Internal Medicine

## 2016-09-04 DIAGNOSIS — I481 Persistent atrial fibrillation: Secondary | ICD-10-CM | POA: Diagnosis not present

## 2016-09-04 DIAGNOSIS — I4891 Unspecified atrial fibrillation: Secondary | ICD-10-CM | POA: Diagnosis present

## 2016-09-04 DIAGNOSIS — I429 Cardiomyopathy, unspecified: Secondary | ICD-10-CM | POA: Insufficient documentation

## 2016-09-04 DIAGNOSIS — Z87891 Personal history of nicotine dependence: Secondary | ICD-10-CM | POA: Diagnosis not present

## 2016-09-04 DIAGNOSIS — Z79899 Other long term (current) drug therapy: Secondary | ICD-10-CM | POA: Insufficient documentation

## 2016-09-04 DIAGNOSIS — I509 Heart failure, unspecified: Secondary | ICD-10-CM | POA: Diagnosis not present

## 2016-09-04 DIAGNOSIS — I10 Essential (primary) hypertension: Secondary | ICD-10-CM | POA: Insufficient documentation

## 2016-09-04 DIAGNOSIS — Z7901 Long term (current) use of anticoagulants: Secondary | ICD-10-CM | POA: Diagnosis not present

## 2016-09-04 DIAGNOSIS — I110 Hypertensive heart disease with heart failure: Secondary | ICD-10-CM | POA: Diagnosis not present

## 2016-09-04 DIAGNOSIS — I480 Paroxysmal atrial fibrillation: Secondary | ICD-10-CM | POA: Diagnosis present

## 2016-09-04 HISTORY — DX: Acute diastolic (congestive) heart failure: I50.31

## 2016-09-04 HISTORY — PX: ELECTROPHYSIOLOGIC STUDY: SHX172A

## 2016-09-04 HISTORY — PX: ABLATION OF DYSRHYTHMIC FOCUS: SHX254

## 2016-09-04 HISTORY — DX: Family history of other specified conditions: Z84.89

## 2016-09-04 LAB — POCT ACTIVATED CLOTTING TIME
ACTIVATED CLOTTING TIME: 356 s
Activated Clotting Time: 329 seconds
Activated Clotting Time: 296 seconds
Activated Clotting Time: 158 seconds

## 2016-09-04 SURGERY — ATRIAL FIBRILLATION ABLATION
Anesthesia: General

## 2016-09-04 MED ORDER — EPHEDRINE SULFATE 50 MG/ML IJ SOLN
INTRAMUSCULAR | Status: DC | PRN
  Administered 2016-09-04: 10 mg via INTRAVENOUS

## 2016-09-04 MED ORDER — FENTANYL CITRATE (PF) 100 MCG/2ML IJ SOLN
INTRAMUSCULAR | Status: DC | PRN
  Administered 2016-09-04: 50 ug via INTRAVENOUS
  Administered 2016-09-04: 100 ug via INTRAVENOUS
  Administered 2016-09-04: 50 ug via INTRAVENOUS

## 2016-09-04 MED ORDER — SPIRONOLACTONE 25 MG PO TABS: 25.0000 mg | ORAL_TABLET | Freq: Every day | ORAL | Status: DC

## 2016-09-04 MED ORDER — PROTAMINE SULFATE 10 MG/ML IV SOLN
30.0000 mg | Freq: Once | INTRAVENOUS | Status: AC
  Administered 2016-09-04: 30 mg via INTRAVENOUS

## 2016-09-04 MED ORDER — LIDOCAINE HCL (CARDIAC) 20 MG/ML IV SOLN
INTRAVENOUS | Status: DC | PRN
  Administered 2016-09-04: 100 mg via INTRATRACHEAL

## 2016-09-04 MED ORDER — RIVAROXABAN 20 MG PO TABS
20.0000 mg | ORAL_TABLET | Freq: Every day | ORAL | Status: DC
  Administered 2016-09-04: 20 mg via ORAL
  Filled 2016-09-04: qty 1

## 2016-09-04 MED ORDER — CARVEDILOL 6.25 MG PO TABS: 6.2500 mg | ORAL_TABLET | Freq: Two times a day (BID) | ORAL | Status: DC

## 2016-09-04 MED ORDER — HEPARIN SODIUM (PORCINE) 1000 UNIT/ML IJ SOLN
INTRAMUSCULAR | Status: DC | PRN
  Administered 2016-09-04: 1000 [IU] via INTRAVENOUS
  Administered 2016-09-04: 12000 [IU] via INTRAVENOUS

## 2016-09-04 MED ORDER — PROPOFOL 10 MG/ML IV BOLUS
INTRAVENOUS | Status: DC | PRN
  Administered 2016-09-04: 200 mg via INTRAVENOUS

## 2016-09-04 MED ORDER — ISOPROTERENOL HCL 0.2 MG/ML IJ SOLN
INTRAMUSCULAR | Status: AC
  Filled 2016-09-04: qty 5

## 2016-09-04 MED ORDER — TORSEMIDE 20 MG PO TABS: 40.0000 mg | ORAL_TABLET | Freq: Every day | ORAL | Status: DC

## 2016-09-04 MED ORDER — BUPIVACAINE HCL (PF) 0.25 % IJ SOLN
INTRAMUSCULAR | Status: AC
  Filled 2016-09-04: qty 30

## 2016-09-04 MED ORDER — SODIUM CHLORIDE 0.9% FLUSH: 3.0000 mL | Freq: Two times a day (BID) | INTRAVENOUS | Status: DC

## 2016-09-04 MED ORDER — ONDANSETRON HCL 4 MG/2ML IJ SOLN
INTRAMUSCULAR | Status: DC | PRN
  Administered 2016-09-04: 4 mg via INTRAVENOUS

## 2016-09-04 MED ORDER — PANTOPRAZOLE SODIUM 40 MG PO TBEC: 40.0000 mg | DELAYED_RELEASE_TABLET | Freq: Every day | ORAL | 0 refills | Status: DC

## 2016-09-04 MED ORDER — PROTAMINE SULFATE 10 MG/ML IV SOLN
INTRAVENOUS | Status: AC
  Filled 2016-09-04: qty 5

## 2016-09-04 MED ORDER — ALBUTEROL SULFATE HFA 108 (90 BASE) MCG/ACT IN AERS: 1.0000 | INHALATION_SPRAY | Freq: Four times a day (QID) | RESPIRATORY_TRACT | Status: DC | PRN

## 2016-09-04 MED ORDER — HEPARIN SODIUM (PORCINE) 1000 UNIT/ML IJ SOLN
INTRAMUSCULAR | Status: AC
  Filled 2016-09-04: qty 1

## 2016-09-04 MED ORDER — GLYCOPYRROLATE 0.2 MG/ML IJ SOLN
INTRAMUSCULAR | Status: DC | PRN
  Administered 2016-09-04 (×2): 0.2 mg via INTRAVENOUS

## 2016-09-04 MED ORDER — SODIUM CHLORIDE 0.9 % IV SOLN: 250.0000 mL | INTRAVENOUS | Status: DC | PRN

## 2016-09-04 MED ORDER — PHENYLEPHRINE HCL 10 MG/ML IJ SOLN
INTRAVENOUS | Status: DC | PRN
  Administered 2016-09-04: 25 ug/min via INTRAVENOUS

## 2016-09-04 MED ORDER — IOPAMIDOL (ISOVUE-370) INJECTION 76%
INTRAVENOUS | Status: DC | PRN
Start: 2016-09-04 — End: 2016-09-04
  Administered 2016-09-04: 103 mL via INTRAVENOUS

## 2016-09-04 MED ORDER — HYDROCODONE-ACETAMINOPHEN 5-325 MG PO TABS: 1.0000 | ORAL_TABLET | ORAL | Status: DC | PRN

## 2016-09-04 MED ORDER — ACETAMINOPHEN 325 MG PO TABS: 650.0000 mg | ORAL_TABLET | ORAL | Status: DC | PRN

## 2016-09-04 MED ORDER — SODIUM CHLORIDE 0.9% FLUSH: 3.0000 mL | INTRAVENOUS | Status: DC | PRN

## 2016-09-04 MED ORDER — LOSARTAN POTASSIUM 50 MG PO TABS: 50.0000 mg | ORAL_TABLET | Freq: Every day | ORAL | Status: DC

## 2016-09-04 MED ORDER — BUPIVACAINE HCL (PF) 0.25 % IJ SOLN
INTRAMUSCULAR | Status: DC | PRN
  Administered 2016-09-04: 30 mL

## 2016-09-04 MED ORDER — ONDANSETRON HCL 4 MG/2ML IJ SOLN: 4.0000 mg | Freq: Four times a day (QID) | INTRAMUSCULAR | Status: DC | PRN

## 2016-09-04 MED ORDER — HEPARIN SODIUM (PORCINE) 1000 UNIT/ML IJ SOLN
INTRAMUSCULAR | Status: DC | PRN
  Administered 2016-09-04: 1000 [IU] via INTRAVENOUS

## 2016-09-04 MED ORDER — MIDAZOLAM HCL 5 MG/5ML IJ SOLN
INTRAMUSCULAR | Status: DC | PRN
  Administered 2016-09-04: 2 mg via INTRAVENOUS

## 2016-09-04 MED ORDER — LACTATED RINGERS IV SOLN
INTRAVENOUS | Status: DC | PRN
  Administered 2016-09-04 (×2): via INTRAVENOUS

## 2016-09-04 MED ORDER — ISOPROTERENOL HCL 0.2 MG/ML IJ SOLN
INTRAVENOUS | Status: DC | PRN
  Administered 2016-09-04: 20 ug/min via INTRAVENOUS

## 2016-09-04 SURGICAL SUPPLY — 21 items
BAG SNAP BAND KOVER 36X36 (MISCELLANEOUS) ×2 IMPLANT
BLANKET WARM UNDERBOD FULL ACC (MISCELLANEOUS) ×2 IMPLANT
CATH DIAG 6FR PIGTAIL (CATHETERS) ×4 IMPLANT
CATH NAVISTAR SMARTTOUCH DF (ABLATOR) ×2 IMPLANT
CATH SOUNDSTAR 3D IMAGING (CATHETERS) ×4 IMPLANT
CATH VARIABLE LASSO NAV 2515 (CATHETERS) ×2 IMPLANT
CATH WEBSTER BI DIR CS D-F CRV (CATHETERS) ×2 IMPLANT
COVER SWIFTLINK CONNECTOR (BAG) ×6 IMPLANT
NEEDLE TRANSEP BRK 71CM 407200 (NEEDLE) ×2 IMPLANT
PACK EP LATEX FREE (CUSTOM PROCEDURE TRAY) ×1
PACK EP LF (CUSTOM PROCEDURE TRAY) ×1 IMPLANT
PAD DEFIB LIFELINK (PAD) ×2 IMPLANT
PATCH CARTO3 (PAD) ×2 IMPLANT
SHEATH AVANTI 11F 11CM (SHEATH) ×2 IMPLANT
SHEATH PINNACLE 7F 10CM (SHEATH) ×4 IMPLANT
SHEATH PINNACLE 9F 10CM (SHEATH) ×2 IMPLANT
SHEATH SWARTZ TS SL2 63CM 8.5F (SHEATH) ×2 IMPLANT
SHIELD RADPAD SCOOP 12X17 (MISCELLANEOUS) ×2 IMPLANT
SYR MEDRAD MARK V 150ML (SYRINGE) ×2 IMPLANT
TUBING CONTRAST HIGH PRESS 48 (TUBING) ×2 IMPLANT
TUBING SMART ABLATE COOLFLOW (TUBING) ×4 IMPLANT

## 2016-09-04 NOTE — Progress Notes (Signed)
Pt discharged home with wife. Discharge instructions reviewed with patient. Patient's groin a level 0.

## 2016-09-04 NOTE — Progress Notes (Signed)
Pt ambulated the hall way after his bedrest. R groin a level 0. V/s stable. Will cont to monitor pt.

## 2016-09-04 NOTE — Anesthesia Postprocedure Evaluation (Signed)
Anesthesia Post Note  Patient: Russell Arnold  Procedure(s) Performed: Procedure(s) (LRB): Atrial Fibrillation Ablation (N/A)  Patient location during evaluation: PACU Anesthesia Type: General Level of consciousness: awake Pain management: pain level controlled Vital Signs Assessment: post-procedure vital signs reviewed and stable Respiratory status: spontaneous breathing Cardiovascular status: stable Postop Assessment: no signs of nausea or vomiting Anesthetic complications: no    Last Vitals:  Vitals:   09/04/16 1230 09/04/16 1400  BP:  116/85  Pulse:  63  Resp:  15  Temp: 36.6 C     Last Pain:  Vitals:   09/04/16 1230  TempSrc: Oral                 Liyana Suniga

## 2016-09-04 NOTE — H&P (Signed)
CC: Afib   History of Present Illness: Russell Arnold is a 55 y.o. male who presents today for afib ablation.   The patient has symptomatic persistent afib.  He was recently admitted with acute decompensated CHF in the setting of AF with RVR.  He was placed on amiodarone.  He is now in sinus rhythm.  He feels "much better".  His EF has recovered.  He is concerned about long term amiodarone.   Today, he denies symptoms of palpitations, chest pain, shortness of breath, orthopnea, PND, lower extremity edema, claudication, dizziness, presyncope, syncope, bleeding, or neurologic sequela. The patient is tolerating medications without difficulties and is otherwise without complaint today.        Past Medical History:  Diagnosis Date  . A-fib (HCC)   . Hypertension         Past Surgical History:  Procedure Laterality Date  . APPENDECTOMY    . CARDIOVERSION N/A 06/14/2016   Procedure: CARDIOVERSION;  Surgeon: Peter M SwazilandJordan, MD;  [facility];  Service: Cardiovascular;  Laterality: N/A;  . CARDIOVERSION N/A 06/17/2016   Procedure: CARDIOVERSION;  Surgeon: Dolores Pattyaniel R Bensimhon, MD;  [facility];  Service: Cardiovascular;  Laterality: N/A;           Current Outpatient Prescriptions  Medication Sig Dispense Refill  . albuterol (PROVENTIL HFA;VENTOLIN HFA) 108 (90 Base) MCG/ACT inhaler Inhale 1 puff into the lungs every 6 (six) hours as needed for wheezing or shortness of breath.    Marland Kitchen. amiodarone (PACERONE) 200 MG tablet Take 1 tablet (200 mg total) by mouth daily. 30 tablet 3  . carvedilol (COREG) 6.25 MG tablet Take 1 tablet (6.25 mg total) by mouth 2 (two) times daily with a meal. 60 tablet 3  . cholecalciferol (VITAMIN D) 1000 units tablet Take 1,000 Units by mouth daily.    . digoxin (LANOXIN) 0.125 MG tablet Take 1 tablet (0.125 mg total) by mouth daily. 30 tablet 5  . losartan (COZAAR) 50 MG tablet Take 1 tablet (50 mg total) by mouth daily. 30 tablet 6  .  ranolazine (RANEXA) 500 MG 12 hr tablet Take 1 tablet (500 mg total) by mouth 2 (two) times daily. 60 tablet 3  . rivaroxaban (XARELTO) 20 MG TABS tablet Take 1 tablet (20 mg total) by mouth daily with supper. 30 tablet 6  . spironolactone (ALDACTONE) 25 MG tablet Take 25 mg by mouth daily.    Marland Kitchen. torsemide (DEMADEX) 20 MG tablet Take 2 tablets (40 mg total) by mouth daily. 30 tablet 5   No current facility-administered medications for this encounter.     Allergies:   Amoxicillin   Social History:  The patient  reports that he has never smoked. He has quit using smokeless tobacco. His smokeless tobacco use included Chew. He reports that he does not drink alcohol or use drugs.   Family History:  The patient's  family history includes Heart failure in his mother.    ROS:  Please see the history of present illness.   All other systems are reviewed and negative.    PHYSICAL EXAM: Vitals:   09/04/16 0537 09/04/16 0618  BP: 119/83   Pulse: 65   Resp:  16  Temp: 98.6 F (37 C)    GEN: Well nourished, well developed, in no acute distress  HEENT: normal  Neck: no JVD, carotid bruits, or masses Cardiac: RRR; no murmurs, rubs, or gallops,no edema  Respiratory:  clear to auscultation bilaterally, normal work of breathing GI: soft, nontender,  nondistended, + BS MS: no deformity or atrophy  Skin: warm and dry  Neuro:  Strength and sensation are intact Psych: euthymic mood, full affect   Recent Labs: reviewed      Wt Readings from Last 3 Encounters:  07/18/16 253 lb 12.8 oz (115.1 kg)  06/21/16 257 lb 9.6 oz (116.8 kg)  06/18/16 258 lb 6.4 oz (117.2 kg)      Other studies Reviewed: Additional studies/ records that were reviewed today include: TEE EF has receovered  ASSESSMENT AND PLAN:  1.  Persistent afib The patient is now in sinus.  He and I agree that long term risks of amiodarone are high. Therapeutic strategies for afib including medicine and ablation  were discussed in detail with the patient today. Risk, benefits, and alternatives to EP study and radiofrequency ablation for afib were also discussed in detail today. These risks include but are not limited to stroke, bleeding, vascular damage, tamponade, perforation, damage to the esophagus, lungs, and other structures, pulmonary vein stenosis, worsening renal function, and death. The patient understands these risk and wishes to proceed.  TEE reviewed with patient today.  2. Nonischemic CM EF has recovered with sinus rhythm  Hillis Range MD, [facility] 09/04/2016 7:29 AM

## 2016-09-04 NOTE — Transfer of Care (Signed)
Immediate Anesthesia Transfer of Care Note  Patient: Russell Arnold  Procedure(s) Performed: Procedure(s): Atrial Fibrillation Ablation (N/A)  Patient Location: PACU  Anesthesia Type:General  Level of Consciousness: awake, alert  and oriented  Airway & Oxygen Therapy: Patient Spontanous Breathing and Patient connected to nasal cannula oxygen  Post-op Assessment: Report given to RN, Post -op Vital signs reviewed and stable and Patient moving all extremities X 4  Post vital signs: Reviewed and stable  Last Vitals:  Vitals:   09/04/16 0618 09/04/16 1049  BP:    Pulse:    Resp: 16   Temp:  36.2 C    Last Pain:  Vitals:   09/04/16 1049  TempSrc: Tympanic      Patients Stated Pain Goal: 0 (09/04/16 1049)  Complications: No apparent anesthesia complications

## 2016-09-04 NOTE — Anesthesia Procedure Notes (Signed)
Procedure Name: LMA Insertion Date/Time: 09/04/2016 8:06 AM Performed by: Marena Chancy Pre-anesthesia Checklist: Patient identified, Emergency Drugs available, Suction available and Patient being monitored Patient Re-evaluated:Patient Re-evaluated prior to inductionOxygen Delivery Method: Circle System Utilized Preoxygenation: Pre-oxygenation with 100% oxygen Intubation Type: IV induction Ventilation: Mask ventilation without difficulty LMA: LMA inserted LMA Size: 5.0 Number of attempts: 1 Airway Equipment and Method: Bite block Placement Confirmation: positive ETCO2 Tube secured with: Tape Dental Injury: Teeth and Oropharynx as per pre-operative assessment

## 2016-09-04 NOTE — Anesthesia Preprocedure Evaluation (Signed)
Anesthesia Evaluation  Patient identified by MRN, date of birth, ID band Patient awake    Reviewed: Allergy & Precautions, NPO status , Patient's Chart, lab work & pertinent test results  History of Anesthesia Complications Negative for: history of anesthetic complications  Airway Mallampati: II  TM Distance: >3 FB Neck ROM: Full    Dental  (+) Teeth Intact   Pulmonary neg pulmonary ROS,    breath sounds clear to auscultation       Cardiovascular hypertension, Pt. on medications +CHF  + dysrhythmias Atrial Fibrillation  Rhythm:Regular     Neuro/Psych negative neurological ROS  negative psych ROS   GI/Hepatic negative GI ROS, Neg liver ROS,   Endo/Other  negative endocrine ROS  Renal/GU negative Renal ROS     Musculoskeletal   Abdominal   Peds  Hematology negative hematology ROS (+)   Anesthesia Other Findings   Reproductive/Obstetrics                             Anesthesia Physical Anesthesia Plan  ASA: II  Anesthesia Plan: General   Post-op Pain Management:    Induction: Intravenous  Airway Management Planned: LMA  Additional Equipment: None  Intra-op Plan:   Post-operative Plan: Extubation in OR  Informed Consent: I have reviewed the patients History and Physical, chart, labs and discussed the procedure including the risks, benefits and alternatives for the proposed anesthesia with the patient or authorized representative who has indicated his/her understanding and acceptance.   Dental advisory given  Plan Discussed with: CRNA and Surgeon  Anesthesia Plan Comments:         Anesthesia Quick Evaluation

## 2016-09-04 NOTE — Progress Notes (Signed)
Site area: rt groin Site Prior to Removal:  Level 0 Pressure Applied For:  20 minutes Manual:   yes Patient Status During Pull:  stable Post Pull Site:  Level 0 Post Pull Instructions Given:  yes Post Pull Pulses Present: yes Dressing Applied:  Small gauze/tegaderm Bedrest begins @ 1155 Comments:

## 2016-09-04 NOTE — Discharge Instructions (Addendum)
No driving for 1 week. No lifting over 5 lbs for 1 week. No vigorous or sexual activity for 1 week. You may return to work on 11/28.17. Keep procedure site clean & dry. If you notice increased pain, swelling, bleeding or pus, call/return!  You may shower, but no soaking baths/hot tubs/pools for 1 week.      You have an appointment set up with the Atrial Fibrillation Clinic.  Multiple studies have shown that being followed by a dedicated atrial fibrillation clinic in addition to the standard care you receive from your other physicians improves health. We believe that enrollment in the atrial fibrillation clinic will allow us to better care for you.   The phone number to the Atrial Fibrillation Clinic is [phone]. The clinic is staffed Monday through Friday from 8:30am to 5pm.  Parking Directions: The clinic is located in the Heart and Vascular Building [facility] to [facility]. 1)From [address] turn on to [address] and go to the 3rd entrance  (Heart and Vascular entrance) on the right. 2)Look to the right for Heart &Vascular Parking Garage. 3)A code for the entrance is required please call the clinic to receive this.   4)Take the elevators to the 1st floor. Registration is in the room with the glass walls at the end of the hallway.  If you have any trouble parking or locating the clinic, please dont hesitate to call [phone].   Information on my medicine - XARELTO (Rivaroxaban)  This medication education was reviewed with me or my healthcare representative as part of my discharge preparation.  Why was Xarelto prescribed for you? Xarelto was prescribed for you to reduce the risk of a blood clot forming that can cause a stroke if you have a medical condition called atrial fibrillation (a type of irregular heartbeat).  What do you need to know about xarelto ? Take your Xarelto ONCE DAILY at the same time every day with your evening meal. If you have difficulty  swallowing the tablet whole, you may crush it and mix in applesauce just prior to taking your dose.  Take Xarelto exactly as prescribed by your doctor and DO NOT stop taking Xarelto without talking to the doctor who prescribed the medication.  Stopping without other stroke prevention medication to take the place of Xarelto may increase your risk of developing a clot that causes a stroke.  Refill your prescription before you run out.  After discharge, you should have regular check-up appointments with your healthcare provider that is prescribing your Xarelto.  In the future your dose may need to be changed if your kidney function or weight changes by a significant amount.  What do you do if you miss a dose? If you are taking Xarelto ONCE DAILY and you miss a dose, take it as soon as you remember on the same day then continue your regularly scheduled once daily regimen the next day. Do not take two doses of Xarelto at the same time or on the same day.   Important Safety Information A possible side effect of Xarelto is bleeding. You should call your healthcare provider right away if you experience any of the following: ? Bleeding from an injury or your nose that does not stop. ? Unusual colored urine (red or dark brown) or unusual colored stools (red or black). ? Unusual bruising for unknown reasons. ? A serious fall or if you hit your head (even if there is no bleeding).  Some medicines may interact with Xarelto  and might increase your risk of bleeding while on Xarelto. To help avoid this, consult your healthcare provider or pharmacist prior to using any new prescription or non-prescription medications, including herbals, vitamins, non-steroidal anti-inflammatory drugs (NSAIDs) and supplements.  This website has more information on Xarelto: VisitDestination.com.br.

## 2016-09-04 NOTE — Discharge Summary (Signed)
ELECTROPHYSIOLOGY PROCEDURE DISCHARGE SUMMARY    Patient ID: Russell Arnold,  MRN: [id number], DOB/AGE: 55-Sep-1962 55 y.o.  Admit date: 09/04/2016 Discharge date: 09/04/16  Electrophysiologist: Hillis Range, MD  Primary Discharge Diagnosis:  1. Persistent Afib     CHA2DS2Vascis at least 23, on Xarelto  Secondary Discharge Diagnosis:  1. NICM 2. HTN  Procedures This Admission:  1.  Electrophysiology study and radiofrequency catheter ablation on 09/04/16 by Dr Hillis Range.   This study demonstrated   CONCLUSIONS: 1. Sinus rhythm upon presentation.   2. Rotational Angiography reveals a moderate sized left atrium with four pulmonary veins without evidence of pulmonary vein stenosis. 3. Successful electrical isolation and anatomical encircling of all four pulmonary veins with radiofrequency current. 4. No inducible arrhythmias following ablation both on and off of Isuprel 5. No early apparent complications.  Brief HPI: Russell Arnold is a 55 y.o. male with a history of persistent atrial fibrillation.  They have been on AAD tx with amiodarone. Risks, benefits, and alternatives to catheter ablation of atrial fibrillation were reviewed with the patient who wished to proceed.  The patient underwent TEE prior to the procedure which demonstrated normal LV function and no LAA thrombus.    Hospital Course:  The patient was admitted and underwent EPS/RFCA of atrial fibrillation with details as outlined above.  They were monitored on telemetry which demonstrated sinus rhythm.  Groin was without complication on the time of discharge.  The patient was examined and considered to be stable for discharge.  Limited echo prior to discharge revealed no pericardial effusion.  Wound care and restrictions were reviewed with the patient.  The patient will be seen back by Rudi Coco, NP in 4 weeks and Dr Johney Frame in 12 weeks for post ablation follow up.     Physical Exam: Vitals:   09/04/16 1230  09/04/16 1400 09/04/16 1500 09/04/16 1529  BP:  116/85 (!) 145/71 112/74  Pulse:  63 63 68  Resp:  15 18 15   Temp: 97.8 F (36.6 C)     TempSrc: Oral     SpO2:  100% 100% 100%  Weight:      Height:        GEN- The patient is well appearing, alert and oriented x 3 today.   HEENT: normocephalic, atraumatic; sclera clear, conjunctiva pink; hearing intact; oropharynx clear; neck supple  Lungs- Clear to ausculation bilaterally, normal work of breathing.  No wheezes, rales, rhonchi Heart- Regular rate and rhythm, no murmurs, rubs or gallops  GI- soft, non-tender, non-distended, bowel sounds present  Extremities- no clubbing, cyanosis, or edema; DP/PT/radial pulses 2+ bilaterally, groin without hematoma/bruit MS- no significant deformity or atrophy Skin- warm and dry, no rash or lesion Psych- euthymic mood, full affect Neuro- strength and sensation are intact   Labs:   Lab Results  Component Value Date   WBC 5.0 08/28/2016   HGB 14.8 08/28/2016   HCT 42.0 08/28/2016   MCV 87.7 08/28/2016   PLT 164 08/28/2016   No results for input(s): NA, K, CL, CO2, BUN, CREATININE, CALCIUM, PROT, BILITOT, ALKPHOS, ALT, AST, GLUCOSE in the last 168 hours.  Invalid input(s): LABALBU   Discharge Medications:    Medication List    STOP taking these medications   amiodarone 200 MG tablet Commonly known as:  PACERONE   digoxin 0.125 MG tablet Commonly known as:  LANOXIN   ranolazine 500 MG 12 hr tablet Commonly known as:  RANEXA   torsemide 20 MG tablet Commonly known  as:  DEMADEX     TAKE these medications   albuterol 108 (90 Base) MCG/ACT inhaler Commonly known as:  PROVENTIL HFA;VENTOLIN HFA Inhale 1 puff into the lungs every 6 (six) hours as needed for wheezing or shortness of breath.   carvedilol 6.25 MG tablet Commonly known as:  COREG Take 1 tablet (6.25 mg total) by mouth 2 (two) times daily with a meal.   cholecalciferol 1000 units tablet Commonly known as:  VITAMIN  D Take 1,000 Units by mouth daily.   losartan 50 MG tablet Commonly known as:  COZAAR Take 1 tablet (50 mg total) by mouth daily.   pantoprazole 40 MG tablet Commonly known as:  PROTONIX Take 1 tablet (40 mg total) by mouth daily.   rivaroxaban 20 MG Tabs tablet Commonly known as:  XARELTO Take 1 tablet (20 mg total) by mouth daily with supper.   spironolactone 25 MG tablet Commonly known as:  ALDACTONE Take 1 tablet (25 mg total) by mouth daily.      Stop ranexa,, amiodarone, digoxin, and torsemide  Disposition:  Home  Follow-up Information    [facility] Follow up on 10/01/2016.   Specialty:  Cardiology Why:  8:30AM Contact information: [address] [id number] [place] [id number] [phone]       Hillis RangeJames Neyland Pettengill, MD Follow up on 12/07/2016.   Specialty:  Cardiology Why:  8:00AM Contact information: [address] [place] [id number] [phone]           Signed, Hillis RangeJames Pattijo Juste MD, [facility] 09/04/2016 55:11 PM

## 2016-09-05 ENCOUNTER — Telehealth: Payer: Self-pay | Admitting: Nurse Practitioner

## 2016-09-05 NOTE — Telephone Encounter (Signed)
Left message for patient to call to follow up on how he is doing after AF ablation yesterday.   Gypsy Balsam, NP 09/05/2016 9:03 AM

## 2016-09-05 NOTE — Telephone Encounter (Signed)
Called all working numbers in epic but unable to contact patient. Left VM for patient to call with any problems

## 2016-10-01 ENCOUNTER — Encounter (HOSPITAL_COMMUNITY): Payer: Self-pay | Admitting: Nurse Practitioner

## 2016-10-01 ENCOUNTER — Ambulatory Visit (HOSPITAL_COMMUNITY)
Admission: RE | Admit: 2016-10-01 | Discharge: 2016-10-01 | Disposition: A | Discharge status: Home / Self Care | Payer: BLUE CROSS/BLUE SHIELD | Source: Ambulatory Visit | Attending: Nurse Practitioner | Admitting: Nurse Practitioner

## 2016-10-01 VITALS — BP 140/96 | HR 71 | Ht 74.0 in | Wt 278.4 lb

## 2016-10-01 DIAGNOSIS — I509 Heart failure, unspecified: Secondary | ICD-10-CM | POA: Diagnosis not present

## 2016-10-01 DIAGNOSIS — I481 Persistent atrial fibrillation: Secondary | ICD-10-CM | POA: Insufficient documentation

## 2016-10-01 DIAGNOSIS — Z8679 Personal history of other diseases of the circulatory system: Secondary | ICD-10-CM | POA: Diagnosis not present

## 2016-10-01 DIAGNOSIS — Z79899 Other long term (current) drug therapy: Secondary | ICD-10-CM | POA: Diagnosis not present

## 2016-10-01 DIAGNOSIS — Z7901 Long term (current) use of anticoagulants: Secondary | ICD-10-CM | POA: Diagnosis not present

## 2016-10-01 DIAGNOSIS — Z880 Allergy status to penicillin: Secondary | ICD-10-CM | POA: Insufficient documentation

## 2016-10-01 DIAGNOSIS — Z9889 Other specified postprocedural states: Secondary | ICD-10-CM

## 2016-10-01 NOTE — Progress Notes (Addendum)
Primary Care Physician: No primary care provider on file. Referring Physician: [facility], [facility]   Russell Arnold is a 55 y.o. male with a h/o persistent  afib  that was newly diagnosed around 4 months ago, with hard to control v rates. He and his wife were vacationing in [place] and he started having issues with heart failure symptoms. He was admitted with progressive SOB on 8/30 and was in afib with RVR. He was found to have reduced EF at 10-15% thought to be TMC. Cardizem was discontinued and started on IV amiodarone and cardioverted to SR. He was started on Xarelto  for stroke prevention.  He returned 9/7 to [facility]  and felt well but unfortunately had returned to afib with v rate of 148 bpm. He did not feel Russell fast heart beat.His weight is stable down one pound from d/c at at 247 bpm. He continues loading on  amiodarone 200 mg bid. Continues on xarelto. He had a sleep study 1-/19 and did not show any significant sleep apnea.   He was seen by Dr. Sanjuan Arnold 10/4 and it was decided that pt would pursue ablation. He is here for labs and to answer any questions re procedure. He had a few questions which were answered but remembers Russell risk vrs benefit of Russell procedure as explained by Dr. Johney Arnold. He is clear on his instructions regarding Russell procedure.  F/u ablation 12/18. He is doing well s/p ablation. He has not noticed any afib. He continues on xarelto. No issues with swallowing Arnold groin issues.  Today, he denies symptoms of palpitations, chest pain, shortness of breath, orthopnea, PND, lower extremity edema, dizziness, presyncope, syncope, Arnold neurologic sequela. Russell patient is tolerating medications without difficulties and is otherwise without complaint today.   Past Medical History:  Diagnosis Date  . A-fib (HCC)   . Acute diastolic heart failure (HCC) 05/2016  . Family history of adverse reaction to anesthesia   . Hypertension    Past Surgical History:  Procedure Laterality  Date  . ABLATION OF DYSRHYTHMIC FOCUS  09/04/2016  . APPENDECTOMY    . CARDIOVERSION N/A 06/14/2016   Procedure: CARDIOVERSION;  Surgeon: Russell M SwazilandJordan, MD;  Location: [facility];  Service: Cardiovascular;  Laterality: N/A;  . CARDIOVERSION N/A 06/17/2016   Procedure: CARDIOVERSION;  Surgeon: Russell Pattyaniel R Bensimhon, MD;  Location: [facility];  Service: Cardiovascular;  Laterality: N/A;  . ELECTROPHYSIOLOGIC STUDY N/A 09/04/2016   Procedure: Atrial Fibrillation Ablation;  Surgeon: Hillis RangeJames Allred, MD;  Location: [facility];  Service: Cardiovascular;  Laterality: N/A;  . TEE WITHOUT CARDIOVERSION N/A 09/03/2016   Procedure: TRANSESOPHAGEAL ECHOCARDIOGRAM (TEE);  Surgeon: Russell FairMihai Croitoru, MD;  Location: [facility];  Service: Cardiovascular;  Laterality: N/A;    Current Outpatient Prescriptions  Medication Sig Dispense Refill  . albuterol (PROVENTIL HFA;VENTOLIN HFA) 108 (90 Base) MCG/ACT inhaler Inhale 1 puff into Russell lungs every 6 (six) hours as needed for wheezing Arnold shortness of breath.    . carvedilol (COREG) 6.25 MG tablet Take 1 tablet (6.25 mg total) by mouth 2 (two) times daily with a meal. 60 tablet 3  . cholecalciferol (VITAMIN D) 1000 units tablet Take 1,000 Units by mouth daily.    Marland Kitchen. losartan (COZAAR) 50 MG tablet Take 1 tablet (50 mg total) by mouth daily. 30 tablet 6  . pantoprazole (PROTONIX) 40 MG tablet Take 1 tablet (40 mg total) by mouth daily. 45 tablet 0  . rivaroxaban (XARELTO) 20 MG TABS  tablet Take 1 tablet (20 mg total) by mouth daily with supper. 30 tablet 6  . spironolactone (ALDACTONE) 25 MG tablet Take 1 tablet (25 mg total) by mouth daily. 30 tablet 3   No current facility-administered medications for this encounter.     Allergies  Allergen Reactions  . Amoxicillin Hives    Hives - noted "red splotches"    Social History   Social History  . Marital status: Married    Spouse name: N/A  . Number of children: N/A  . Years of education: N/A   Occupational  History  . Company secretary    Social History Main Topics  . Smoking status: Never Smoker  . Smokeless tobacco: Former Neurosurgeon    Types: Chew  . Alcohol use No  . Drug use: No  . Sexual activity: Not on file   Other Topics Concern  . Not on file   Social History Narrative  . No narrative on file    Family History  Problem Relation Age of Onset  . Heart failure Mother     ROS- All systems are reviewed and negative except as per Russell HPI above  Physical Exam: Vitals:   10/01/16 0827  BP: (!) 140/96  Pulse: 71  Weight: 278 lb 6.4 oz (126.3 kg)  Height: 6\' 2"  (1.88 m)    GEN- Russell patient is well appearing, alert and oriented x 3 today.   Head- normocephalic, atraumatic Eyes-  Sclera clear, conjunctiva pink Ears- hearing intact Oropharynx- clear Neck- supple, no JVP Lymph- no cervical lymphadenopathy Lungs- Clear to ausculation bilaterally, normal work of breathing Heart- regular rate and rhythm, no murmurs, rubs Arnold gallops, PMI not laterally displaced GI- soft, NT, ND, + BS Extremities- no clubbing, cyanosis, Arnold edema MS- no significant deformity Arnold atrophy Skin- no rash Arnold lesion Psych- euthymic mood, full affect Neuro- strength and sensation are intact  EKG-Sinus rhythm at 71 bpm, pr int 168 ms, qrs int 94 ms, qtc 456 ms Epic records reviewed  Assessment and Plan:  1. Persistent afib with RVR and TMC Doing well maintaining SR s/p ablation Off amiodarone Continue xarelto, states no missed doese Continue coreg    2. CHF EF returned to normal 55-60% on TEE with SR Fluid status stable Continue spironolactone Daily weights  Avoid salt   F/u with Dr. Johney Frame 2/23 afib clinic as needed  [facility] [address] [place], [place] [address] [phone]

## 2016-10-01 NOTE — Addendum Note (Signed)
Encounter addended by: Newman Nip, NP on: 10/01/2016  9:33 AM<BR>    Actions taken: Sign clinical note

## 2016-11-18 ENCOUNTER — Other Ambulatory Visit (HOSPITAL_COMMUNITY): Payer: Self-pay | Admitting: Nurse Practitioner

## 2016-11-18 ENCOUNTER — Other Ambulatory Visit (HOSPITAL_COMMUNITY): Payer: Self-pay | Admitting: Internal Medicine

## 2016-11-19 ENCOUNTER — Other Ambulatory Visit (HOSPITAL_COMMUNITY): Payer: Self-pay | Admitting: *Deleted

## 2016-11-19 MED ORDER — CARVEDILOL 6.25 MG PO TABS: 6.2500 mg | ORAL_TABLET | Freq: Two times a day (BID) | ORAL | 6 refills | Status: DC

## 2016-11-20 NOTE — Telephone Encounter (Signed)
Medication Detail    Disp Refills Start End   carvedilol (COREG) 6.25 MG tablet 60 tablet 6 11/19/2016    Sig - Route: Take 1 tablet (6.25 mg total) by mouth 2 (two) times daily with a meal. - Oral   E-Prescribing Status: Receipt confirmed by pharmacy (11/19/2016 12:07 PM EST)   Pharmacy   [id number] - [place], [place] - [address]

## 2016-11-26 DIAGNOSIS — M5441 Lumbago with sciatica, right side: Secondary | ICD-10-CM | POA: Diagnosis not present

## 2016-12-05 DIAGNOSIS — M5442 Lumbago with sciatica, left side: Secondary | ICD-10-CM | POA: Diagnosis not present

## 2016-12-05 DIAGNOSIS — G8929 Other chronic pain: Secondary | ICD-10-CM | POA: Diagnosis not present

## 2016-12-05 DIAGNOSIS — M546 Pain in thoracic spine: Secondary | ICD-10-CM | POA: Diagnosis not present

## 2016-12-05 DIAGNOSIS — S29019A Strain of muscle and tendon of unspecified wall of thorax, initial encounter: Secondary | ICD-10-CM | POA: Diagnosis not present

## 2016-12-07 ENCOUNTER — Other Ambulatory Visit: Payer: Self-pay

## 2016-12-07 ENCOUNTER — Ambulatory Visit (INDEPENDENT_AMBULATORY_CARE_PROVIDER_SITE_OTHER): Payer: BLUE CROSS/BLUE SHIELD | Admitting: Internal Medicine

## 2016-12-07 VITALS — BP 136/88 | HR 70 | Ht 74.0 in | Wt 288.2 lb

## 2016-12-07 DIAGNOSIS — I519 Heart disease, unspecified: Secondary | ICD-10-CM | POA: Diagnosis not present

## 2016-12-07 DIAGNOSIS — I428 Other cardiomyopathies: Secondary | ICD-10-CM

## 2016-12-07 DIAGNOSIS — Z9889 Other specified postprocedural states: Secondary | ICD-10-CM

## 2016-12-07 DIAGNOSIS — Z8679 Personal history of other diseases of the circulatory system: Secondary | ICD-10-CM | POA: Diagnosis not present

## 2016-12-07 DIAGNOSIS — I481 Persistent atrial fibrillation: Secondary | ICD-10-CM

## 2016-12-07 DIAGNOSIS — I4819 Other persistent atrial fibrillation: Secondary | ICD-10-CM

## 2016-12-07 MED ORDER — RIVAROXABAN 20 MG PO TABS: 20.0000 mg | ORAL_TABLET | Freq: Every day | ORAL | 3 refills | Status: DC

## 2016-12-07 MED ORDER — LOSARTAN POTASSIUM 50 MG PO TABS: 50.0000 mg | ORAL_TABLET | Freq: Every day | ORAL | 3 refills | Status: DC

## 2016-12-07 NOTE — Progress Notes (Signed)
Cardiologist:  Russell Arnold  Russell Arnold is a 56 y.o. male his ablation, the patient reports doing very well. Denies procedure related complications and is pleased with current state. Today, he denies symptoms of palpitations, chest pain, shortness of breath,  lower extremity edema, dizziness, presyncope, or syncope.  The patient is otherwise without complaint today.   Past Medical History:  Diagnosis Date  . A-fib (HCC)   . Acute diastolic heart failure (HCC) 05/2016  . Family history of adverse reaction to anesthesia   . Hypertension    Past Surgical History:  Procedure Laterality Date  . ABLATION OF DYSRHYTHMIC FOCUS  09/04/2016  . APPENDECTOMY    . CARDIOVERSION N/A 06/14/2016   Procedure: CARDIOVERSION;  Surgeon: Peter M Swaziland, MD;  Location: [facility] END[facility]COPY;  Service: Cardiovascular;  Laterality: N/A;  . CARDIOVERSION N/A 06/17/2016   Procedure: CARDIOVERSION;  Surgeon: Dolores Patty, MD;  Location: [facility] OR;  Service: Cardiovascular;  Laterality: N/A;  . ELECTROPHYSIOLOGIC STUDY N/A 09/04/2016   Procedure: Atrial Fibrillation Ablation;  Surgeon: Hillis Range, MD;  [facility] INVASIVE CV LAB;  Service: Cardiovascular;  Laterality: N/A;  . TEE WITHOUT CARDIOVERSION N/A 09/03/2016   Procedure: TRANSESOPHAGEAL ECHOCARDIOGRAM (TEE);  Surgeon: Thurmon Fair, MD;  Location: [facility] ENDOSCOPY;  Service: Cardiovascular;  Laterality: N/A;    ROS- all systems are reviewed and negatives except as per HPI above  Current Outpatient Prescriptions  Medication Sig Dispense Refill  . carvedilol (COREG) 6.25 MG tablet Take 1 tablet (6.25 mg total) by mouth 2 (two) times daily with a meal. 60 tablet 6  . cholecalciferol (VITAMIN D) 1000 units tablet Take 1,000 Units by mouth daily.    Marland Kitchen losartan (COZAAR) 50 MG tablet Take 1 tablet (50 mg total) by mouth daily. 30 tablet 6  . rivaroxaban (XARELTO) 20 MG TABS tablet Take 1 tablet (20 mg total) by mouth daily with supper. 30 tablet 6  . spironolactone  (ALDACTONE) 25 MG tablet TAKE ONE TABLET BY MOUTH ONCE DAILY 30 tablet 3   No current facility-administered medications for this visit.     Physical Exam: Vitals:   12/07/16 0809  BP: 136/88  Pulse: 70  Weight: 288 lb 3.2 oz (130.7 kg)  Height: 6\' 2"  (1.88 m)    GEN- The patient is well appearing, alert and oriented x 3 today.   Head- normocephalic, atraumatic Eyes-  Sclera clear, conjunctiva pink Ears- hearing intact Oropharynx- clear Lungs- Clear to ausculation bilaterally, normal work of breathing Heart- Regular rate and rhythm, no murmurs, rubs or gallops, PMI not laterally displaced GI- soft, NT, ND, + BS Extremities- no clubbing, cyanosis, or edema  ekg today reveals sinus rhythm 70 bpm with PACs  Assessment and Plan:  1. Persistent afib Doing well s/p ablation off AAD therapy On xarelto (chads2vasc score is 1-2)  2. HTN Stable No change required today  3. CHF EF has recovered with sinus rhythm No changes today  Return to see me in 3 months Repeat echo in 6 months  Hillis Range MD, [facility] 12/07/2016 8:34 AM

## 2016-12-07 NOTE — Patient Instructions (Addendum)
Medication Instructions:  None ordered  Labwork: None ordered   Testing/Procedures: None ordered   Follow-Up: Your physician recommends that you schedule a follow-up appointment in: 3 months with Dr Allred   Any Other Special Instructions Will Be Listed Below (If Applicable).     If you need a refill on your cardiac medications before your next appointment, please call your pharmacy.   

## 2017-02-20 ENCOUNTER — Encounter: Payer: Self-pay | Admitting: Internal Medicine

## 2017-03-13 ENCOUNTER — Ambulatory Visit (INDEPENDENT_AMBULATORY_CARE_PROVIDER_SITE_OTHER): Payer: BLUE CROSS/BLUE SHIELD | Admitting: Internal Medicine

## 2017-03-13 ENCOUNTER — Encounter: Payer: Self-pay | Admitting: Internal Medicine

## 2017-03-13 VITALS — BP 138/94 | HR 70 | Ht 73.5 in | Wt 285.4 lb

## 2017-03-13 DIAGNOSIS — I428 Other cardiomyopathies: Secondary | ICD-10-CM | POA: Diagnosis not present

## 2017-03-13 DIAGNOSIS — I481 Persistent atrial fibrillation: Secondary | ICD-10-CM | POA: Diagnosis not present

## 2017-03-13 DIAGNOSIS — I10 Essential (primary) hypertension: Secondary | ICD-10-CM

## 2017-03-13 DIAGNOSIS — I4819 Other persistent atrial fibrillation: Secondary | ICD-10-CM

## 2017-03-13 NOTE — Progress Notes (Signed)
Russell Arnold is a 56 y.o. male who presents today for routine electrophysiology followup.  Since last being seen in our clinic, the patient reports doing very well.  Today, he denies symptoms of palpitations, chest pain, shortness of breath,  lower extremity edema,  presyncope, or syncope.  + rare low BP with dizziness. Most BPs are within the normal range.  The patient is otherwise without complaint today.   Past Medical History:  Diagnosis Date  . A-fib (HCC)   . Acute diastolic heart failure (HCC) 05/2016  . Family history of adverse reaction to anesthesia   . Hypertension    Past Surgical History:  Procedure Laterality Date  . ABLATION OF DYSRHYTHMIC FOCUS  09/04/2016  . APPENDECTOMY    . CARDIOVERSION N/A 06/14/2016   Procedure: CARDIOVERSION;  Surgeon: Peter M Swaziland, MD;  Location: [facility] ENDOSCOPY;  Service: Cardiovascular;  Laterality: N/A;  . CARDIOVERSION N/A 06/17/2016   Procedure: CARDIOVERSION;  Surgeon: Dolores Patty, MD;  Location: [facility] OR;  Service: Cardiovascular;  Laterality: N/A;  . ELECTROPHYSIOLOGIC STUDY N/A 09/04/2016   Procedure: Atrial Fibrillation Ablation;  Surgeon: Hillis Range, MD;  Location: [facility] INVASIVE CV LAB;  Service: Cardiovascular;  Laterality: N/A;  . TEE WITHOUT CARDIOVERSION N/A 09/03/2016   Procedure: TRANSESOPHAGEAL ECHOCARDIOGRAM (TEE);  Surgeon: Thurmon Fair, MD;  Location: [facility] ENDOSCOPY;  Service: Cardiovascular;  Laterality: N/A;    ROS- all systems are reviewed and negatives except as per HPI above  Current Outpatient Prescriptions  Medication Sig Dispense Refill  . rivaroxaban (XARELTO) 20 MG TABS tablet Take 1 tablet (20 mg total) by mouth daily with supper. 90 tablet 3  . carvedilol (COREG) 6.25 MG tablet Take 1 tablet (6.25 mg total) by mouth 2 (two) times daily with a meal. 60 tablet 6  . cholecalciferol (VITAMIN D) 1000 units tablet Take 1,000 Units by mouth daily.    Marland Kitchen losartan (COZAAR) 50 MG tablet Take 1 tablet (50 mg total) by  mouth daily. 90 tablet 3  . spironolactone (ALDACTONE) 25 MG tablet TAKE ONE TABLET BY MOUTH ONCE DAILY 30 tablet 3   No current facility-administered medications for this visit.     Physical Exam: Vitals:   03/13/17 0946  BP: (!) 138/94  Pulse: 70  SpO2: 98%  Weight: 285 lb 6.4 oz (129.5 kg)  Height: 6' 1.5" (1.867 m)    GEN- The patient is well appearing, alert and oriented x 3 today.   Head- normocephalic, atraumatic Eyes-  Sclera clear, conjunctiva pink Ears- hearing intact Oropharynx- clear Lungs- Clear to ausculation bilaterally, normal work of breathing Heart- Regular rate and rhythm, no murmurs, rubs or gallops, PMI not laterally displaced GI- soft, NT, ND, + BS Extremities- no clubbing, cyanosis, or edema  EKG tracing ordered today is personally reviewed and shows sinus rhythm 70 bpm, otherwise normal ekg  Assessment and Plan:  1. Persistent atrial fibrillation Doing very well s/p ablation off AAD therapy. chads2vasc score is 1 He is on xarelto and wishes to discontinue this medicine.  We had a long discussion about pros and cons of anticoagulation.  Per his preference, we will stop xarelto today.  2. HTN Stable, he has had several low blood pressures with dizziness Stop spironolactone.  If BP increases, he can restart spironolactone 12.5mg  daily  3. Prior tachycardia mediated CM EF has recovered post ablation Stop spironolactone as above Repeat echo  Return to see Lupita Leash in AF clinic in 3 months I will see in 6 months  Fayrene Fearing  Jalani Rominger MD, [facility] 03/13/2017 10:00 AM

## 2017-03-13 NOTE — Patient Instructions (Addendum)
Medication Instructions:  Your physician has recommended you make the following change in your medication:  1) Stop Aldactone 2) Stop Xarelto   Labwork: None ordered   Testing/Procedures:  Your physician has requested that you have an echocardiogram. Echocardiography is a painless test that uses sound waves to create images of your heart. It provides your doctor with information about the size and shape of your heart and how well your heart's chambers and valves are working. This procedure takes approximately one hour. There are no restrictions for this procedure.    Follow-Up: Your physician recommends that you schedule a follow-up appointment in: 3 months with Rudi Coco, NP and 6 months with Dr Johney Frame   Any Other Special Instructions Will Be Listed Below (If Applicable).     If you need a refill on your cardiac medications before your next appointment, please call your pharmacy.

## 2017-03-13 NOTE — Addendum Note (Signed)
Addended by: Dennis Bast F on: 03/13/2017 10:26 AM   Modules accepted: Orders

## 2017-03-21 ENCOUNTER — Other Ambulatory Visit: Payer: Self-pay

## 2017-03-21 ENCOUNTER — Ambulatory Visit (HOSPITAL_COMMUNITY): Payer: BLUE CROSS/BLUE SHIELD | Attending: Cardiology

## 2017-03-21 DIAGNOSIS — I481 Persistent atrial fibrillation: Secondary | ICD-10-CM | POA: Insufficient documentation

## 2017-03-21 DIAGNOSIS — I700 Atherosclerosis of aorta: Secondary | ICD-10-CM | POA: Diagnosis not present

## 2017-03-21 DIAGNOSIS — I428 Other cardiomyopathies: Secondary | ICD-10-CM

## 2017-03-21 DIAGNOSIS — I4819 Other persistent atrial fibrillation: Secondary | ICD-10-CM

## 2017-03-21 MED ORDER — PERFLUTREN LIPID MICROSPHERE
1.0000 mL | INTRAVENOUS | Status: AC | PRN
  Administered 2017-03-21: 2 mL via INTRAVENOUS

## 2017-06-10 DIAGNOSIS — H903 Sensorineural hearing loss, bilateral: Secondary | ICD-10-CM | POA: Diagnosis not present

## 2017-06-10 DIAGNOSIS — H6122 Impacted cerumen, left ear: Secondary | ICD-10-CM | POA: Diagnosis not present

## 2017-06-13 ENCOUNTER — Encounter (HOSPITAL_COMMUNITY): Payer: Self-pay | Admitting: Nurse Practitioner

## 2017-06-13 ENCOUNTER — Ambulatory Visit (HOSPITAL_COMMUNITY)
Admission: RE | Admit: 2017-06-13 | Discharge: 2017-06-13 | Disposition: A | Discharge status: Home / Self Care | Payer: BLUE CROSS/BLUE SHIELD | Source: Ambulatory Visit | Attending: Nurse Practitioner | Admitting: Nurse Practitioner

## 2017-06-13 VITALS — BP 126/96 | HR 79 | Ht 73.5 in | Wt 288.8 lb

## 2017-06-13 DIAGNOSIS — I5032 Chronic diastolic (congestive) heart failure: Secondary | ICD-10-CM | POA: Diagnosis not present

## 2017-06-13 DIAGNOSIS — I4819 Other persistent atrial fibrillation: Secondary | ICD-10-CM

## 2017-06-13 DIAGNOSIS — I481 Persistent atrial fibrillation: Secondary | ICD-10-CM

## 2017-06-13 DIAGNOSIS — I110 Hypertensive heart disease with heart failure: Secondary | ICD-10-CM | POA: Insufficient documentation

## 2017-06-13 NOTE — Progress Notes (Signed)
Primary Care Physician: Patient, No Pcp Per Referring Physician: Dr. Johney Frame, [facility] f/u   Russell Arnold is a 56 y.o. male with a h/o persistent  afib  that was newly diagnosed around 4 months ago, with hard to control v rates. He and his wife were vacationing in [place] and he started having issues with heart failure symptoms. He was admitted with progressive SOB on 8/30 and was in afib with RVR. He was found to have reduced EF at 10-15% thought to be TMC. Cardizem was discontinued and started on IV amiodarone and cardioverted to SR. He was started on Xarelto  for stroke prevention.  He returned 9/7 to the afib clinic  and felt well but unfortunately had returned to afib with v rate of 148 bpm. He did not feel the fast heart beat.His weight is stable down one pound from d/c at at 247 bpm. He continues loading on  amiodarone 200 mg bid. Continues on xarelto. He had a sleep study 1-/19 and did not show any significant sleep apnea.   He was seen by Dr. Sanjuan Dame 10/4 and it was decided that pt would pursue ablation. He is here for labs and to answer any questions re procedure. He had a few questions which were answered but remembers the risk vrs benefit of the procedure as explained by Dr. Johney Frame. He is clear on his instructions regarding the procedure.  F/u ablation 12/18. He is doing well s/p ablation. He has not noticed any afib. He continues on xarelto. No issues with swallowing or groin issues.  Continues to do well without any afib on f/u in afib clinic 8/30. He feels well. He just come off a cruise and has gained about 20 lbs since last November.   Today, he denies symptoms of palpitations, chest pain, shortness of breath, orthopnea, PND, lower extremity edema, dizziness, presyncope, syncope, or neurologic sequela. The patient is tolerating medications without difficulties and is otherwise without complaint today.   Past Medical History:  Diagnosis Date  . A-fib (HCC)   . Acute diastolic  heart failure (HCC) 81/1572  . Family history of adverse reaction to anesthesia   . Hypertension    Past Surgical History:  Procedure Laterality Date  . ABLATION OF DYSRHYTHMIC FOCUS  09/04/2016  . APPENDECTOMY    . CARDIOVERSION N/A 06/14/2016   Procedure: CARDIOVERSION;  Surgeon: Peter M Swaziland, MD;  Location: [facility] ENDOSCOPY;  Service: Cardiovascular;  Laterality: N/A;  . CARDIOVERSION N/A 06/17/2016   Procedure: CARDIOVERSION;  Surgeon: Dolores Patty, MD;  Location: [facility] OR;  Service: Cardiovascular;  Laterality: N/A;  . ELECTROPHYSIOLOGIC STUDY N/A 09/04/2016   Procedure: Atrial Fibrillation Ablation;  Surgeon: Hillis Range, MD;  Location: [facility] INVASIVE CV LAB;  Service: Cardiovascular;  Laterality: N/A;  . TEE WITHOUT CARDIOVERSION N/A 09/03/2016   Procedure: TRANSESOPHAGEAL ECHOCARDIOGRAM (TEE);  Surgeon: Thurmon Fair, MD;  Location[facility] ENDOSCOPY;  Service: Cardiovascular;  Laterality: N/A;    Current Outpatient Prescriptions  Medication Sig Dispense Refill  . carvedilol (COREG) 6.25 MG tablet Take 1 tablet (6.25 mg total) by mouth 2 (two) times daily with a meal. 60 tablet 6  . cholecalciferol (VITAMIN D) 1000 units tablet Take 1,000 Units by mouth daily.    Marland Kitchen losartan (COZAAR) 50 MG tablet Take 1 tablet (50 mg total) by mouth daily. 90 tablet 3   No current facility-administered medications for this encounter.     Allergies  Allergen Reactions  . Amoxicillin Hives    Hives -  noted "red splotches" - also ineffective per pt    Social History   Social History  . Marital status: Married    Spouse name: N/A  . Number of children: N/A  . Years of education: N/A   Occupational History  . Company secretary    Social History Main Topics  . Smoking status: Never Smoker  . Smokeless tobacco: Former Neurosurgeon    Types: Chew  . Alcohol use No  . Drug use: No  . Sexual activity: Not on file   Other Topics Concern  . Not on file   Social History Narrative  . No narrative on  file    Family History  Problem Relation Age of Onset  . Heart failure Mother     ROS- All systems are reviewed and negative except as per the HPI above  Physical Exam: Vitals:   06/13/17 0925  BP: (!) 126/96  Pulse: 79  Weight: 288 lb 12.8 oz (131 kg)  Height: 6' 1.5" (1.867 m)    GEN- The patient is well appearing, alert and oriented x 3 today.   Head- normocephalic, atraumatic Eyes-  Sclera clear, conjunctiva pink Ears- hearing intact Oropharynx- clear Neck- supple, no JVP Lymph- no cervical lymphadenopathy Lungs- Clear to ausculation bilaterally, normal work of breathing Heart- regular rate and rhythm, no murmurs, rubs or gallops, PMI not laterally displaced GI- soft, NT, ND, + BS Extremities- no clubbing, cyanosis, or edema MS- no significant deformity or atrophy Skin- no rash or lesion Psych- euthymic mood, full affect Neuro- strength and sensation are intact  EKG-Sinus rhythm at 79 bpm, pr int 162 ms, qrs int 98 ms, qtc 449 ms Epic records reviewed  Assessment and Plan:  1. Persistent afib with RVR and TMC Doing well maintaining SR s/p ablation Off amiodarone Off xarelto with a chadsvsc score of 1 Continue coreg   2. CHF EF returned to normal 55-60% on TEE with SR  3. Htn Has noticed that BP is running a little higher than usual Discussed that weight gain is contributing IF he can lose 5-10 lbs, BP will probably be improved IF not losartan may need to be increased to 100 mg daily  He will work on weight   F/u with Dr. Johney Frame as scheduled 09/16/17 afib clinic as needed  Lupita Leash C. [name] [facility] [address] [place], [place] [address] [phone]

## 2017-06-24 ENCOUNTER — Encounter (HOSPITAL_COMMUNITY): Payer: Self-pay | Admitting: Nurse Practitioner

## 2017-06-24 ENCOUNTER — Ambulatory Visit (HOSPITAL_COMMUNITY)
Admission: RE | Admit: 2017-06-24 | Discharge: 2017-06-24 | Disposition: A | Discharge status: Home / Self Care | Payer: BLUE CROSS/BLUE SHIELD | Source: Ambulatory Visit | Attending: Nurse Practitioner | Admitting: Nurse Practitioner

## 2017-06-24 ENCOUNTER — Other Ambulatory Visit (HOSPITAL_COMMUNITY): Payer: Self-pay | Admitting: *Deleted

## 2017-06-24 VITALS — HR 162 | Ht 73.5 in | Wt 284.2 lb

## 2017-06-24 DIAGNOSIS — R635 Abnormal weight gain: Secondary | ICD-10-CM | POA: Insufficient documentation

## 2017-06-24 DIAGNOSIS — I110 Hypertensive heart disease with heart failure: Secondary | ICD-10-CM | POA: Insufficient documentation

## 2017-06-24 DIAGNOSIS — I4891 Unspecified atrial fibrillation: Secondary | ICD-10-CM | POA: Diagnosis not present

## 2017-06-24 DIAGNOSIS — Z880 Allergy status to penicillin: Secondary | ICD-10-CM | POA: Insufficient documentation

## 2017-06-24 DIAGNOSIS — Z7902 Long term (current) use of antithrombotics/antiplatelets: Secondary | ICD-10-CM | POA: Insufficient documentation

## 2017-06-24 DIAGNOSIS — Z9889 Other specified postprocedural states: Secondary | ICD-10-CM | POA: Insufficient documentation

## 2017-06-24 DIAGNOSIS — Z6836 Body mass index (BMI) 36.0-36.9, adult: Secondary | ICD-10-CM | POA: Insufficient documentation

## 2017-06-24 DIAGNOSIS — I5032 Chronic diastolic (congestive) heart failure: Secondary | ICD-10-CM | POA: Diagnosis not present

## 2017-06-24 DIAGNOSIS — I4892 Unspecified atrial flutter: Secondary | ICD-10-CM

## 2017-06-24 DIAGNOSIS — I4819 Other persistent atrial fibrillation: Secondary | ICD-10-CM

## 2017-06-24 LAB — CBC
HEMATOCRIT: 49.9 % (ref 39.0–52.0)
HEMOGLOBIN: 17.7 g/dL — AB (ref 13.0–17.0)
MCH: 32.7 pg (ref 26.0–34.0)
MCHC: 35.5 g/dL (ref 30.0–36.0)
MCV: 92.1 fL (ref 78.0–100.0)
Platelets: 177 10*3/uL (ref 150–400)
RBC: 5.42 MIL/uL (ref 4.22–5.81)
RDW: 12.9 % (ref 11.5–15.5)
WBC: 7.3 10*3/uL (ref 4.0–10.5)

## 2017-06-24 LAB — BASIC METABOLIC PANEL
Anion gap: 8 (ref 5–15)
BUN: 19 mg/dL (ref 6–20)
CHLORIDE: 104 mmol/L (ref 101–111)
CO2: 28 mmol/L (ref 22–32)
CREATININE: 1.13 mg/dL (ref 0.61–1.24)
Calcium: 9.3 mg/dL (ref 8.9–10.3)
GFR calc Af Amer: >60 mL/min (ref >60)
GFR calc non Af Amer: >60 mL/min (ref >60)
Glucose, Bld: 133 mg/dL — ABNORMAL HIGH (ref 65–99)
Potassium: 4.2 mmol/L (ref 3.5–5.1)
SODIUM: 140 mmol/L (ref 135–145)

## 2017-06-24 LAB — TSH: TSH: 0.223 u[IU]/mL — ABNORMAL LOW (ref 0.350–4.500)

## 2017-06-24 MED ORDER — RIVAROXABAN 20 MG PO TABS: 20.0000 mg | ORAL_TABLET | Freq: Every day | ORAL | 3 refills | Status: DC

## 2017-06-24 MED ORDER — CARVEDILOL 12.5 MG PO TABS: 12.5000 mg | ORAL_TABLET | Freq: Two times a day (BID) | ORAL | 3 refills | Status: DC

## 2017-06-24 NOTE — Progress Notes (Signed)
Primary Care Physician: Patient, No Pcp Per Referring Physician: Dr. Johney Frame, [facility] f/u   Russell Arnold is a 56 y.o. male with a h/o persistent  afib  that was newly diagnosed around 4 months ago, with hard to control v rates. He and his wife were vacationing in [place] and he started having issues with heart failure symptoms. He was admitted with progressive SOB on 8/30 and was in afib with RVR. He was found to have reduced EF at 10-15% thought to be TMC. Cardizem was discontinued and started on IV amiodarone and cardioverted to SR. He was started on Xarelto  for stroke prevention.  He returned 9/7 to the afib clinic  and felt well but unfortunately had returned to afib with v rate of 148 bpm. He did not feel the fast heart beat.His weight is stable down one pound from d/c at at 247 bpm. He continues loading on  amiodarone 200 mg bid. Continues on xarelto. He had a sleep study 1-/19 and did not show any significant sleep apnea.   He was seen by Dr. Sanjuan Dame 10/4 and it was decided that pt would pursue ablation. He is here for labs and to answer any questions re procedure. He had a few questions which were answered but remembers the risk vrs benefit of the procedure as explained by Dr. Johney Frame. He is clear on his instructions regarding the procedure.  F/u ablation 12/18. He is doing well s/p ablation. He has not noticed any afib. He continues on xarelto. No issues with swallowing or groin issues.  Continues to do well without any afib on f/u in afib clinic 8/30. He feels well. He just come off a cruise and has gained about 20 lbs since last November.   F/u with urgent appointment 9/10. He noted profuse sweating last Wednesday and Thursday, checked hid heart rate Friday Am and noted fast HR in the 150-160's. This has persisted over the weekend and EKG shows flutter at 162 bpm. BP at home running around 120-130 sys but just able to palpate 120 sys today. Warm and dry. He is tolerating well but he had  TMC with previous fast rates. He is off xarelto for a chadsvasc score of 1.   Today, he denies symptoms of palpitations, chest pain, shortness of breath, orthopnea, PND, lower extremity edema, dizziness, presyncope, syncope, or neurologic sequela. The patient is tolerating medications without difficulties and is otherwise without complaint today.   Past Medical History:  Diagnosis Date  . A-fib (HCC)   . Acute diastolic heart failure (HCC) 05/2016  . Family history of adverse reaction to anesthesia   . Hypertension    Past Surgical History:  Procedure Laterality Date  . ABLATION OF DYSRHYTHMIC FOCUS  09/04/2016  . APPENDECTOMY    . CARDIOVERSION N/A 06/14/2016   Procedure: CARDIOVERSION;  Surgeon: Peter M Swaziland, MD;  Location: [facility] ENDOSCOPY;  Service: Cardiovascular;  Laterality: N/A;  . CARDIOVERSION N/A 06/17/2016   Procedure: CARDIOVERSION;  Surgeon: Dolores Patty, MD;  Location: [facility] OR;  Service: Cardiovascular;  Laterality: N/A;  . ELECTROPHYSIOLOGIC STUDY N/A 09/04/2016   Procedure: Atrial Fibrillation Ablation;  Surgeon: Hillis Range, MD;  Location: [facility] INVASIVE CV LAB;  Service: Cardiovascular;  Laterality: N/A;  . TEE WITHOUT CARDIOVERSION N/A 09/03/2016   Procedure: TRANSESOPHAGEAL ECHOCARDIOGRAM (TEE);  Surgeon: Thurmon Fair, MD;  Location: [facility] ENDOSCOPY;  Service: Cardiovascular;  Laterality: N/A;    Current Outpatient Prescriptions  Medication Sig Dispense Refill  . carvedilol (COREG) 12.5  MG tablet Take 1 tablet (12.5 mg total) by mouth 2 (two) times daily with a meal. 60 tablet 3  . cholecalciferol (VITAMIN D) 1000 units tablet Take 1,000 Units by mouth daily.    Marland Kitchen losartan (COZAAR) 50 MG tablet Take 1 tablet (50 mg total) by mouth daily. 90 tablet 3  . rivaroxaban (XARELTO) 20 MG TABS tablet Take 1 tablet (20 mg total) by mouth daily with supper. 30 tablet 3   No current facility-administered medications for this encounter.     Allergies  Allergen Reactions  .  Amoxicillin Hives    Hives - noted "red splotches" - also ineffective per pt    Social History   Social History  . Marital status: Married    Spouse name: N/A  . Number of children: N/A  . Years of education: N/A   Occupational History  . Company secretary    Social History Main Topics  . Smoking status: Never Smoker  . Smokeless tobacco: Former Neurosurgeon    Types: Chew  . Alcohol use No  . Drug use: No  . Sexual activity: Not on file   Other Topics Concern  . Not on file   Social History Narrative  . No narrative on file    Family History  Problem Relation Age of Onset  . Heart failure Mother     ROS- All systems are reviewed and negative except as per the HPI above  Physical Exam: Vitals:   06/24/17 0910  Pulse: (!) 162  Weight: 284 lb 3.2 oz (128.9 kg)  Height: 6' 1.5" (1.867 m)  BP palpable at 118 sys(at home registers at 120-130 sys)  GEN- The patient is well appearing, alert and oriented x 3 today.   Head- normocephalic, atraumatic Eyes-  Sclera clear, conjunctiva pink Ears- hearing intact Oropharynx- clear Neck- supple, no JVP Lymph- no cervical lymphadenopathy Lungs- Clear to ausculation bilaterally, normal work of breathing Heart- rapid and regular rate and rhythm, no murmurs, rubs or gallops, PMI not laterally displaced GI- soft, NT, ND, + BS Extremities- no clubbing, cyanosis, or edema MS- no significant deformity or atrophy Skin- no rash or lesion Psych- euthymic mood, full affect Neuro- strength and sensation are intact  EKG-atrial flutter at 162 bpm with 2:1 AV conduction Epic records reviewed  Assessment and Plan:  1. A flutter with RVR and TMC Possibly returned to atrial flutter last Wednesday Off amiodarone for months now Off xarelto with a chadsvsc score of 1 for months He is out of the 48 hour window to send to ER for urgent cardioversion  Will start back on xarelto 20 mg daily, plan on cardioversion Thursday with TEE having at that  point 3 doses of xarelto, (first available outpt available) . Take an extra 6.125 mg coreg on arrival home and increase to 12.5 bid, keeping a close watch of BP, want to keep sys BP at lest above 100 Return on Wednesday for EKG  To ER at any time pt feels unstable Encouraged him to keep in close contact with afib office He will take off work until after cardioversion  2. CHF/TCM EF returned to normal 55-60% on TEE with SR Want to get him back in rhtyhm as soon as possible  3. Weight gain He has gained around 20 lbs over the last several months  Encouraged weight loss, possible trigger    afib clinic Wednesday  [facility] [address] [place], [place] [address] [phone]

## 2017-06-24 NOTE — Patient Instructions (Addendum)
Restart Xarelto 20mg  once a day with supper. Increase Coreg to 12.5mg  twice a day as long as blood pressure on top is above 100.  Cardioversion scheduled for Thursday, September 13th  - Arrive at the [employer] [facility] and go to admitting at 9:30AM  -Do not eat or drink anything after midnight the night prior to your procedure.  - Take all your medication with a sip of water prior to arrival.  - You will not be able to drive home after your procedure.

## 2017-06-25 LAB — T4, FREE: Free T4: 0.96 ng/dL (ref 0.61–1.12)

## 2017-06-26 ENCOUNTER — Encounter (HOSPITAL_COMMUNITY): Payer: Self-pay | Admitting: Certified Registered Nurse Anesthetist

## 2017-06-26 ENCOUNTER — Other Ambulatory Visit: Payer: Self-pay

## 2017-06-26 ENCOUNTER — Encounter (HOSPITAL_COMMUNITY): Payer: Self-pay | Admitting: Nurse Practitioner

## 2017-06-26 ENCOUNTER — Ambulatory Visit (HOSPITAL_COMMUNITY)
Admission: RE | Admit: 2017-06-26 | Discharge: 2017-06-26 | Disposition: A | Discharge status: Home / Self Care | Payer: BLUE CROSS/BLUE SHIELD | Source: Ambulatory Visit | Attending: Nurse Practitioner | Admitting: Nurse Practitioner

## 2017-06-26 DIAGNOSIS — I4892 Unspecified atrial flutter: Secondary | ICD-10-CM | POA: Insufficient documentation

## 2017-06-26 DIAGNOSIS — I471 Supraventricular tachycardia: Secondary | ICD-10-CM | POA: Insufficient documentation

## 2017-06-26 LAB — T3, FREE: T3 FREE: 3.4 pg/mL (ref 2.0–4.4)

## 2017-06-26 NOTE — Progress Notes (Addendum)
Pt in for repeat EKG since increasing carvedilol and restarting the xarelto.  EKG to be reviewed by Rudi Coco, NP  Pt continues in atrial flutter at 168 bpm, despite increase of BB, Soft BP not allowing significant increase in rate. Tolerating well at first, now noticing fatigue. For Tee guided cardioversion tomorrow am.. He feels as though he has picked up some weight in his abdomen. No PND, orthopnea, or LEE. Can take 1/2 dose of torsemide tomorrow after cardioversion. Will have been on xarelto x 3 does by time of cardioversion. TSH elevated with pre procedure labs but T3/T4 ok.

## 2017-06-27 ENCOUNTER — Ambulatory Visit (HOSPITAL_COMMUNITY): Payer: BLUE CROSS/BLUE SHIELD

## 2017-06-27 ENCOUNTER — Ambulatory Visit (HOSPITAL_COMMUNITY)
Admission: RE | Admit: 2017-06-27 | Discharge: 2017-06-27 | Disposition: A | Discharge status: Home / Self Care | Payer: BLUE CROSS/BLUE SHIELD | Source: Ambulatory Visit | Attending: Nurse Practitioner | Admitting: Nurse Practitioner

## 2017-06-27 ENCOUNTER — Ambulatory Visit (HOSPITAL_COMMUNITY)
Admission: RE | Admit: 2017-06-27 | Payer: BLUE CROSS/BLUE SHIELD | Source: Ambulatory Visit | Admitting: Cardiovascular Disease

## 2017-06-27 ENCOUNTER — Encounter (HOSPITAL_COMMUNITY): Payer: Self-pay | Admitting: Nurse Practitioner

## 2017-06-27 DIAGNOSIS — I4891 Unspecified atrial fibrillation: Secondary | ICD-10-CM | POA: Insufficient documentation

## 2017-06-27 SURGERY — ECHOCARDIOGRAM, TRANSESOPHAGEAL
Anesthesia: Monitor Anesthesia Care

## 2017-06-27 MED ORDER — TORSEMIDE 20 MG PO TABS: ORAL_TABLET | ORAL | Status: DC

## 2017-06-27 NOTE — Progress Notes (Addendum)
Pt in for EKG since he felt he had gone back into normal rhythm. Pt reported high BP.  EKG to be reviewed by Rudi Coco, NP   Pt woke up this am in SR, converted spontaneously, and confirmed with EKG in office, so cardioversion for today cancelled. He feels that he has around 5 lbs extra water weight and BP is elevated at 146/90. He has toremide 40 mg at home. He will take 1/2 tab x 3 days or until he gets back to dry weight. He will continue with coreg at current dose and will continue with xarelto for now.f/u here in one month. He will work on the 20 lbs he gained. He has f/u with Dr. Johney Frame in December.  EKG- NSR at 74 bpm, pr int 176 ms, qrs int 80 ms, qtc 430 ms

## 2017-06-27 NOTE — Patient Instructions (Signed)
Your physician has recommended you make the following change in your medication: 1)Torsemide 1/2 tablet daily until return to normal weight.

## 2017-07-25 ENCOUNTER — Ambulatory Visit (HOSPITAL_COMMUNITY)
Admission: RE | Admit: 2017-07-25 | Discharge: 2017-07-25 | Disposition: A | Discharge status: Home / Self Care | Payer: BLUE CROSS/BLUE SHIELD | Source: Ambulatory Visit | Attending: Nurse Practitioner | Admitting: Nurse Practitioner

## 2017-07-25 ENCOUNTER — Other Ambulatory Visit: Payer: Self-pay

## 2017-07-25 ENCOUNTER — Encounter (HOSPITAL_COMMUNITY): Payer: Self-pay | Admitting: Nurse Practitioner

## 2017-07-25 VITALS — BP 142/90 | HR 67 | Ht 73.5 in | Wt 282.8 lb

## 2017-07-25 DIAGNOSIS — I4892 Unspecified atrial flutter: Secondary | ICD-10-CM

## 2017-07-25 DIAGNOSIS — R635 Abnormal weight gain: Secondary | ICD-10-CM | POA: Insufficient documentation

## 2017-07-25 DIAGNOSIS — I110 Hypertensive heart disease with heart failure: Secondary | ICD-10-CM | POA: Diagnosis not present

## 2017-07-25 DIAGNOSIS — I481 Persistent atrial fibrillation: Secondary | ICD-10-CM | POA: Diagnosis present

## 2017-07-25 DIAGNOSIS — Z8249 Family history of ischemic heart disease and other diseases of the circulatory system: Secondary | ICD-10-CM | POA: Insufficient documentation

## 2017-07-25 DIAGNOSIS — Z880 Allergy status to penicillin: Secondary | ICD-10-CM | POA: Diagnosis not present

## 2017-07-25 DIAGNOSIS — I509 Heart failure, unspecified: Secondary | ICD-10-CM | POA: Insufficient documentation

## 2017-07-25 DIAGNOSIS — Z79899 Other long term (current) drug therapy: Secondary | ICD-10-CM | POA: Insufficient documentation

## 2017-07-25 DIAGNOSIS — Z7901 Long term (current) use of anticoagulants: Secondary | ICD-10-CM | POA: Diagnosis not present

## 2017-07-25 DIAGNOSIS — Z6836 Body mass index (BMI) 36.0-36.9, adult: Secondary | ICD-10-CM | POA: Diagnosis not present

## 2017-07-25 NOTE — Progress Notes (Signed)
Primary Care Physician: Patient, No Pcp Per Referring Physician: Dr. Johney Frame, [facility] f/u   Maurion Walkowiak is a 56 y.o. male with a h/o persistent  afib  that was newly diagnosed over a year ago, with hard to control v rates. He and his wife were vacationing in [place] and he started having issues with heart failure symptoms. He was admitted with progressive SOB on 06/13/16 and was in afib with RVR. He was found to have reduced EF at 10-15% thought to be TMC. Cardizem was discontinued and started on IV amiodarone and cardioverted to SR. He was started on Xarelto  for stroke prevention.  He returned 06/21/16 to the afib clinic  and felt well but unfortunately had returned to afib with v rate of 148 bpm. He did not feel the fast heart beat.His weight was stable. He continued loading on  amiodarone 200 mg bid. Continued on xarelto. He had a sleep study 1-/19 and did not show any significant sleep apnea.   He was seen by Dr. Sanjuan Dame 07/18/16 and it was decided that pt would pursue ablation.  F/u ablation 10/01/17. He was doing well s/p ablation. He had not noticed any afib. He continued  on xarelto.  Continues to do well without any afib on f/u in afib clinic 8/30. He felt well. He had just come off a cruise and has gained about 20 lbs since last November.   F/u with urgent appointment 06/24/17. He noted profuse sweating last Wednesday and Thursday, checked hid heart rate Friday am and noted fast HR in the 150-160's. This  persisted over the weekend and EKG showed flutter at 162 bpm. BP at home running around 120-130 sys but just able to palpate 120 sys today. Warm and dry. He is tolerating well but he had TMC with previous fast rates. He is off xarelto for a chadsvasc score of 1.   F/u 9/13, he was scheduled for cardioversion for that day, but he spontaneously converted to SR.  He is in the clinic for one month f/u. He reports that he continues to go in and out of a rapid rhythm, lasting sometimes  3-4 hours, occurring several times a week. He had recently gained weight, has lost 2 lbs. . He is in SR today. When he has the faster rates, his BP drops.   Today, he denies symptoms of chest pain, shortness of breath, orthopnea, PND, lower extremity edema, dizziness, presyncope, syncope, or neurologic sequela. Positive for palpitations. The patient is tolerating medications without difficulties and is otherwise without complaint today.   Past Medical History:  Diagnosis Date  . A-fib (HCC)   . Acute diastolic heart failure (HCC) 05/2016  . Family history of adverse reaction to anesthesia   . Hypertension    Past Surgical History:  Procedure Laterality Date  . ABLATION OF DYSRHYTHMIC FOCUS  09/04/2016  . APPENDECTOMY    . CARDIOVERSION N/A 06/14/2016   Procedure: CARDIOVERSION;  Surgeon: Peter M Swaziland, MD;  Location[facility] ENDOSCOPY;  Service: Cardiovascular;  Laterality: N/A;  . CARDIOVERSION N/A 06/17/2016   Procedure: CARDIOVERSION;  Surgeon: Dolores Patty, MD;  Location: [facility] OR;  Service: Cardiovascular;  Laterality: N/A;  . ELECTROPHYSIOLOGIC STUDY N/A 09/04/2016   Procedure: Atrial Fibrillation Ablation;  Surgeon: Hillis Range, MD;  Location: [facility] INVASIVE CV LAB;  Service: Cardiovascular;  Laterality: N/A;  . TEE WITHOUT CARDIOVERSION N/A 09/03/2016   Procedure: TRANSESOPHAGEAL ECHOCARDIOGRAM (TEE);  Surgeon: Thurmon Fair, MD;  Location: [facility] ENDOSCOPY;  Service: Cardiovascular;  Laterality: N/A;    Current Outpatient Prescriptions  Medication Sig Dispense Refill  . carvedilol (COREG) 12.5 MG tablet Take 1 tablet (12.5 mg total) by mouth 2 (two) times daily with a meal. 60 tablet 3  . cholecalciferol (VITAMIN D) 1000 units tablet Take 1,000 Units by mouth daily.    Marland Kitchen losartan (COZAAR) 50 MG tablet Take 1 tablet (50 mg total) by mouth daily. 90 tablet 3  . rivaroxaban (XARELTO) 20 MG TABS tablet Take 1 tablet (20 mg total) by mouth daily with supper. 30 tablet 3  . torsemide  (DEMADEX) 20 MG tablet Take 1 tablet by mouth as needed for weight gain.     No current facility-administered medications for this encounter.     Allergies  Allergen Reactions  . Amoxicillin Hives    Hives - noted "red splotches" - also ineffective per pt    Social History   Social History  . Marital status: Married    Spouse name: N/A  . Number of children: N/A  . Years of education: N/A   Occupational History  . Company secretary    Social History Main Topics  . Smoking status: Never Smoker  . Smokeless tobacco: Former Neurosurgeon    Types: Chew  . Alcohol use No  . Drug use: No  . Sexual activity: Not on file   Other Topics Concern  . Not on file   Social History Narrative  . No narrative on file    Family History  Problem Relation Age of Onset  . Heart failure Mother     ROS- All systems are reviewed and negative except as per the HPI above  Physical Exam: Vitals:   07/25/17 0858  BP: (!) 142/90  Pulse: 67  Weight: 282 lb 12.8 oz (128.3 kg)  Height: 6' 1.5" (1.867 m)  BP palpable at 118 sys(at home registers at 120-130 sys)  GEN- The patient is well appearing, alert and oriented x 3 today.   Head- normocephalic, atraumatic Eyes-  Sclera clear, conjunctiva pink Ears- hearing intact Oropharynx- clear Neck- supple, no JVP Lymph- no cervical lymphadenopathy Lungs- Clear to ausculation bilaterally, normal work of breathing Heart- regular rate and rhythm, no murmurs, rubs or gallops, PMI not laterally displaced GI- soft, NT, ND, + BS Extremities- no clubbing, cyanosis, or edema MS- no significant deformity or atrophy Skin- no rash or lesion Psych- euthymic mood, full affect Neuro- strength and sensation are intact  EKG- Sinus rhythm at 67 bpm, pr int 156 ms, qrs 96 sec, qtc 433 ms Epic records reviewed  Assessment and Plan:  1. A flutter with RVR and  h/oTMC  Recently with increased burden of tachy arrhythmia's He would like to see Dr. Johney Frame to discuss  repeat ablation Off amiodarone for months now Has been back on xarelto since plans for cardioversion last month,continue on this without missed doses in anticipation of repeat ablation, chadsvasc score is 1 Continue carvedilol at  12.5 bid, hesitate to add more rate control as BP drops when out of rhythm    2. CHF/TCM EF returned to normal 55-60%  with SR  3. Weight gain He has gained around 20 lbs over the last several months  Encouraged weight loss, possible trigger, last 2 lbs recently    Will request move up appointment with Dr. Johney Frame from 12/3 to the next couple of weeks, if pt needs repeat ablation he wants it this year.  [name] [name]  [address] [place], [place] [address] [phone]

## 2017-07-26 ENCOUNTER — Ambulatory Visit (INDEPENDENT_AMBULATORY_CARE_PROVIDER_SITE_OTHER): Payer: BLUE CROSS/BLUE SHIELD | Admitting: Internal Medicine

## 2017-07-26 ENCOUNTER — Encounter: Payer: Self-pay | Admitting: Internal Medicine

## 2017-07-26 VITALS — BP 132/94 | HR 72 | Ht 74.0 in | Wt 284.2 lb

## 2017-07-26 DIAGNOSIS — I10 Essential (primary) hypertension: Secondary | ICD-10-CM | POA: Diagnosis not present

## 2017-07-26 DIAGNOSIS — I481 Persistent atrial fibrillation: Secondary | ICD-10-CM

## 2017-07-26 DIAGNOSIS — I4892 Unspecified atrial flutter: Secondary | ICD-10-CM

## 2017-07-26 DIAGNOSIS — I4819 Other persistent atrial fibrillation: Secondary | ICD-10-CM

## 2017-07-26 NOTE — Patient Instructions (Addendum)
Medication Instructions:  Your physician recommends that you continue on your current medications as directed. Please refer to the Current Medication list given to you today.    Labwork: Your physician recommends that you return for lab work prior to CT and Ablation:  Week of 10/22: CBC/BMET   Testing/Procedures: Your physician has recommended that you have an ablation---08/22/2017 Catheter ablation is a medical procedure used to treat some cardiac arrhythmias (irregular heartbeats). During catheter ablation, a long, thin, flexible tube is put into a blood vessel in your groin (upper thigh), or neck. This tube is called an ablation catheter. It is then guided to your heart through the blood vessel. Radio frequency waves destroy small areas of heart tissue where abnormal heartbeats may cause an arrhythmia to start. Please see the instruction sheet given to you today.  Please arrive at [facility] at 5:30am Do not eat or drink after midnight the night prior to the procedure Do not take any medications the morning of the test, continue Xarelto Plan for one night stay Will need someone to drive you home at discharge  Your physician has requested that you have cardiac CT week of October 29th. Cardiac computed tomography (CT) is a painless test that uses an x-ray machine to take clear, detailed pictures of your heart. For further information please visit [email or web address] Please follow instruction sheet as given.        Follow-Up: 4 weeks with Sebastian Ache in Afib clinic and 3 months with Dr. Johney Frame.  Any Other Special Instructions Will Be Listed Below (If Applicable).     If you need a refill on your cardiac medications before your next appointment, please call your pharmacy.

## 2017-07-26 NOTE — Progress Notes (Signed)
PCP: Patient, No Pcp Per   Primary EP: Dr Johney Frame  Russell Arnold is a 56 y.o. male who presents today for routine electrophysiology followup.  Since last being seen in our clinic, the patient reports doing very well.  He has had recurrent symptoms of afib and documented atrial flutter in September.  He has also had significant weight gain. Today, he denies symptoms of palpitations, chest pain, shortness of breath,  lower extremity edema, dizziness, presyncope, or syncope.  The patient is otherwise without complaint today.   Past Medical History:  Diagnosis Date  . A-fib (HCC)   . Acute diastolic heart failure (HCC) 05/2016  . Family history of adverse reaction to anesthesia   . Hypertension    Past Surgical History:  Procedure Laterality Date  . ABLATION OF DYSRHYTHMIC FOCUS  09/04/2016  . APPENDECTOMY    . CARDIOVERSION N/A 06/14/2016   Procedure: CARDIOVERSION;  Surgeon: Peter M Swaziland, MD;  Location: [facility] ENDOSCOPY;  Service: Cardiovascular;  Laterality: N/A;  . CARDIOVERSION N/A 06/17/2016   Procedure: CARDIOVERSION;  Surgeon: Dolores Patty, MD;  Location: [facility] OR;  Service: Cardiovascular;  Laterality: N/A;  . ELECTROPHYSIOLOGIC STUDY N/A 09/04/2016   Procedure: Atrial Fibrillation Ablation;  Surgeon: Hillis Range, MD;  Location: [facility] INVASIVE CV LAB;  Service: Cardiovascular;  Laterality: N/A;  . TEE WITHOUT CARDIOVERSION N/A 09/03/2016   Procedure: TRANSESOPHAGEAL ECHOCARDIOGRAM (TEE);  Surgeon: Thurmon Fair, MD;  Location: [facility] ENDOSCOPY;  Service: Cardiovascular;  Laterality: N/A;    ROS- all systems are reviewed and negatives except as per HPI above  Current Outpatient Prescriptions  Medication Sig Dispense Refill  . carvedilol (COREG) 12.5 MG tablet Take 1 tablet (12.5 mg total) by mouth 2 (two) times daily with a meal. 60 tablet 3  . cholecalciferol (VITAMIN D) 1000 units tablet Take 1,000 Units by mouth daily.    Marland Kitchen losartan (COZAAR) 50 MG tablet Take 1 tablet (50  mg total) by mouth daily. 90 tablet 3  . rivaroxaban (XARELTO) 20 MG TABS tablet Take 1 tablet (20 mg total) by mouth daily with supper. 30 tablet 3  . torsemide (DEMADEX) 20 MG tablet Take 10 mg by mouth daily as needed (Weight gain).     No current facility-administered medications for this visit.     Physical Exam: Vitals:   07/26/17 1015  BP: (!) 132/94  Pulse: 72  SpO2: 97%  Weight: 284 lb 3.2 oz (128.9 kg)  Height: 6\' 2"  (1.88 m)    GEN- The patient is well appearing, alert and oriented x 3 today.   Head- normocephalic, atraumatic Eyes-  Sclera clear, conjunctiva pink Ears- hearing intact Oropharynx- clear Lungs- Clear to ausculation bilaterally, normal work of breathing Heart- Regular rate and rhythm, no murmurs, rubs or gallops, PMI not laterally displaced GI- soft, NT, ND, + BS Extremities- no clubbing, cyanosis, or edema  EKG tracing 06/26/17 reveals typical appearing atrial flutter.  EKg yesterday reveals sinus rhythm  Assessment and Plan:  1. Persistent afib/ atrial flutter Recurrent symptomatic arrhythmias post ablation Therapeutic strategies for afib/ atrial flutter including medicine and ablation were discussed in detail with the patient today. Risk, benefits, and alternatives to EP study and radiofrequency ablation were also discussed in detail today. These risks include but are not limited to stroke, bleeding, vascular damage, tamponade, perforation, damage to the esophagus, lungs, and other structures, pulmonary vein stenosis, worsening renal function, and death. The patient understands these risk and wishes to proceed.  We will therefore proceed with  catheter ablation at the next available time.  Cardiac CT is ordered prior to ablation.  2. HTN Stable No change required today  3. Overweight Body mass index is 36.49 kg/m. We discussed lifestyle modification at length today   Hillis Range MD, [facility] 07/26/2017 10:35 AM

## 2017-08-05 ENCOUNTER — Encounter: Payer: Self-pay | Admitting: Internal Medicine

## 2017-08-05 ENCOUNTER — Other Ambulatory Visit: Payer: BLUE CROSS/BLUE SHIELD | Admitting: *Deleted

## 2017-08-05 DIAGNOSIS — I4819 Other persistent atrial fibrillation: Secondary | ICD-10-CM

## 2017-08-05 DIAGNOSIS — I481 Persistent atrial fibrillation: Secondary | ICD-10-CM | POA: Diagnosis not present

## 2017-08-05 LAB — BASIC METABOLIC PANEL
BUN/Creatinine Ratio: 23 — ABNORMAL HIGH (ref 9–20)
BUN: 22 mg/dL (ref 6–24)
CALCIUM: 8.6 mg/dL — AB (ref 8.7–10.2)
CO2: 23 mmol/L (ref 20–29)
CREATININE: 0.95 mg/dL (ref 0.76–1.27)
Chloride: 106 mmol/L (ref 96–106)
GFR calc Af Amer: 104 mL/min/{1.73_m2} (ref >59)
GFR, EST NON AFRICAN AMERICAN: 90 mL/min/{1.73_m2} (ref >59)
GLUCOSE: 85 mg/dL (ref 65–99)
POTASSIUM: 4.3 mmol/L (ref 3.5–5.2)
SODIUM: 145 mmol/L — AB (ref 134–144)

## 2017-08-05 LAB — CBC WITH DIFFERENTIAL/PLATELET
BASOS: 1 %
Basophils Absolute: 0.1 10*3/uL (ref 0.0–0.2)
EOS (ABSOLUTE): 0.2 10*3/uL (ref 0.0–0.4)
Eos: 4 %
Hematocrit: 42.1 % (ref 37.5–51.0)
Hemoglobin: 14.4 g/dL (ref 13.0–17.7)
IMMATURE GRANS (ABS): 0 10*3/uL (ref 0.0–0.1)
IMMATURE GRANULOCYTES: 0 %
LYMPHS: 24 %
Lymphocytes Absolute: 1.3 10*3/uL (ref 0.7–3.1)
MCH: 32.1 pg (ref 26.6–33.0)
MCHC: 34.2 g/dL (ref 31.5–35.7)
MCV: 94 fL (ref 79–97)
MONOS ABS: 0.6 10*3/uL (ref 0.1–0.9)
Monocytes: 11 %
NEUTROS PCT: 60 %
Neutrophils Absolute: 3.2 10*3/uL (ref 1.4–7.0)
PLATELETS: 170 10*3/uL (ref 150–379)
RBC: 4.48 x10E6/uL (ref 4.14–5.80)
RDW: 13 % (ref 12.3–15.4)
WBC: 5.3 10*3/uL (ref 3.4–10.8)

## 2017-08-16 ENCOUNTER — Encounter (HOSPITAL_COMMUNITY): Payer: Self-pay

## 2017-08-16 ENCOUNTER — Ambulatory Visit (HOSPITAL_BASED_OUTPATIENT_CLINIC_OR_DEPARTMENT_OTHER)
Admission: RE | Admit: 2017-08-16 | Discharge: 2017-08-16 | Disposition: A | Discharge status: Home / Self Care | Payer: BLUE CROSS/BLUE SHIELD | Source: Ambulatory Visit | Admitting: Nurse Practitioner

## 2017-08-16 ENCOUNTER — Other Ambulatory Visit: Payer: Self-pay

## 2017-08-16 ENCOUNTER — Ambulatory Visit (HOSPITAL_COMMUNITY)
Admission: RE | Admit: 2017-08-16 | Discharge: 2017-08-16 | Disposition: A | Discharge status: Home / Self Care | Payer: BLUE CROSS/BLUE SHIELD | Source: Ambulatory Visit | Attending: Internal Medicine | Admitting: Internal Medicine

## 2017-08-16 VITALS — BP 138/74 | HR 157 | Ht 74.0 in | Wt 279.0 lb

## 2017-08-16 DIAGNOSIS — Z79899 Other long term (current) drug therapy: Secondary | ICD-10-CM | POA: Insufficient documentation

## 2017-08-16 DIAGNOSIS — I10 Essential (primary) hypertension: Secondary | ICD-10-CM | POA: Diagnosis not present

## 2017-08-16 DIAGNOSIS — Z8249 Family history of ischemic heart disease and other diseases of the circulatory system: Secondary | ICD-10-CM | POA: Diagnosis not present

## 2017-08-16 DIAGNOSIS — Z791 Long term (current) use of non-steroidal anti-inflammatories (NSAID): Secondary | ICD-10-CM | POA: Diagnosis not present

## 2017-08-16 DIAGNOSIS — Z8489 Family history of other specified conditions: Secondary | ICD-10-CM | POA: Diagnosis not present

## 2017-08-16 DIAGNOSIS — I481 Persistent atrial fibrillation: Secondary | ICD-10-CM | POA: Diagnosis not present

## 2017-08-16 DIAGNOSIS — I5032 Chronic diastolic (congestive) heart failure: Secondary | ICD-10-CM | POA: Insufficient documentation

## 2017-08-16 DIAGNOSIS — Z7901 Long term (current) use of anticoagulants: Secondary | ICD-10-CM | POA: Insufficient documentation

## 2017-08-16 DIAGNOSIS — Z880 Allergy status to penicillin: Secondary | ICD-10-CM | POA: Diagnosis not present

## 2017-08-16 DIAGNOSIS — I4819 Other persistent atrial fibrillation: Secondary | ICD-10-CM

## 2017-08-16 DIAGNOSIS — I110 Hypertensive heart disease with heart failure: Secondary | ICD-10-CM | POA: Diagnosis not present

## 2017-08-16 DIAGNOSIS — Z9889 Other specified postprocedural states: Secondary | ICD-10-CM | POA: Diagnosis not present

## 2017-08-16 LAB — CBC
HCT: 49.5 % (ref 39.0–52.0)
Hemoglobin: 17.6 g/dL — ABNORMAL HIGH (ref 13.0–17.0)
MCH: 32.5 pg (ref 26.0–34.0)
MCHC: 35.6 g/dL (ref 30.0–36.0)
MCV: 91.3 fL (ref 78.0–100.0)
PLATELETS: 182 10*3/uL (ref 150–400)
RBC: 5.42 MIL/uL (ref 4.22–5.81)
RDW: 13.1 % (ref 11.5–15.5)
WBC: 8.1 10*3/uL (ref 4.0–10.5)

## 2017-08-16 LAB — BASIC METABOLIC PANEL
Anion gap: 9 (ref 5–15)
BUN: 13 mg/dL (ref 6–20)
CALCIUM: 9.4 mg/dL (ref 8.9–10.3)
CO2: 24 mmol/L (ref 22–32)
Chloride: 104 mmol/L (ref 101–111)
Creatinine, Ser: 1.06 mg/dL (ref 0.61–1.24)
GFR calc Af Amer: >60 mL/min (ref >60)
GLUCOSE: 106 mg/dL — AB (ref 65–99)
Potassium: 4.1 mmol/L (ref 3.5–5.1)
SODIUM: 137 mmol/L (ref 135–145)

## 2017-08-16 LAB — MAGNESIUM: MAGNESIUM: 1.9 mg/dL (ref 1.7–2.4)

## 2017-08-16 MED ORDER — CARVEDILOL 25 MG PO TABS: 25.0000 mg | ORAL_TABLET | Freq: Two times a day (BID) | ORAL | 3 refills | Status: DC

## 2017-08-16 MED ORDER — CARVEDILOL 12.5 MG PO TABS: 25.0000 mg | ORAL_TABLET | Freq: Two times a day (BID) | ORAL | 3 refills | Status: DC

## 2017-08-16 NOTE — H&P (View-Only) (Signed)
Primary Care Physician: Patient, No Pcp Per Primary Electrophysiologist: Maximo Spratling  Russell Arnold is a 56 y.o. male with a history of persistent atrial fibrillation and atrial flutter who presents for follow up in the [facility]. He was scheduled for cardiac CT scan today prior to ablation.  On arrival, his heart rate was found to be 150's and he was referred to be seen for further evaluation.  He is pending ablation 08/22/17.  He reports that yesterday he had some dizzy spells and his blood pressure and HR was normal.  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 He drank extra fluids yesterday and felt good today without awareness of AF on arrival. Today, he  denies symptoms of palpitations, chest pain, shortness of breath, orthopnea, PND, lower extremity edema, dizziness, presyncope, syncope, snoring, daytime somnolence, bleeding, or neurologic sequela. The patient is tolerating medications without difficulties and is otherwise without complaint today.    Past Medical History:  Diagnosis Date  . A-fib (HCC)   . Acute diastolic heart failure (HCC) 05/2016  . Family history of adverse reaction to anesthesia   . Hypertension    Past Surgical History:  Procedure Laterality Date  . ABLATION OF DYSRHYTHMIC FOCUS  09/04/2016  . APPENDECTOMY    . CARDIOVERSION N/A 06/14/2016   Procedure: CARDIOVERSION;  Surgeon: Peter M Swaziland, MD;  Location: [facility] ENDOSCOPY;  Service: Cardiovascular;  Laterality: N/A;  . CARDIOVERSION N/A 06/17/2016   Procedure: CARDIOVERSION;  Surgeon: Dolores Patty, MD;  Location: [facility] OR;  Service: Cardiovascular;  Laterality: N/A;  . ELECTROPHYSIOLOGIC STUDY N/A 09/04/2016   Procedure: Atrial Fibrillation Ablation;  Surgeon: Hillis Range, MD;  Location: [facility] INVASIVE CV LAB;  Service: Cardiovascular;  Laterality: N/A;  . TEE WITHOUT CARDIOVERSION N/A 09/03/2016   Procedure: TRANSESOPHAGEAL ECHOCARDIOGRAM  (TEE);  Surgeon: Thurmon Fair, MD;  Location: [facility] ENDOSCOPY;  Service: Cardiovascular;  Laterality: N/A;    Current Outpatient Prescriptions  Medication Sig Dispense Refill  . acetaminophen (TYLENOL) 500 MG tablet Take 1,000 mg by mouth every 6 (six) hours as needed (for muscle aches/headaches.).    Marland Kitchen carvedilol (COREG) 12.5 MG tablet Take 1 tablet (12.5 mg total) by mouth 2 (two) times daily with a meal. 60 tablet 3  . cholecalciferol (VITAMIN D) 1000 units tablet Take 1,000 Units by mouth daily.    Marland Kitchen ibuprofen (ADVIL,MOTRIN) 200 MG tablet Take 400 mg by mouth every 8 (eight) hours as needed (for headaches/muscle aches.).    Marland Kitchen losartan (COZAAR) 50 MG tablet Take 1 tablet (50 mg total) by mouth daily. 90 tablet 3  . oxymetazoline (AFRIN) 0.05 % nasal spray Place 1 spray into both nostrils 2 (two) times daily as needed for congestion.    . rivaroxaban (XARELTO) 20 MG TABS tablet Take 1 tablet (20 mg total) by mouth daily with supper. 30 tablet 3  . torsemide (DEMADEX) 20 MG tablet Take 10 mg by mouth daily as needed (for weight gain/fluid retention.).      No current facility-administered medications for this encounter.     Allergies  Allergen Reactions  . Amoxicillin Hives    Hives - noted "red splotches" - also ineffective per pt Has patient had a PCN reaction causing immediate rash, facial/tongue/throat swelling, SOB or lightheadedness with hypotension: No Has patient had a PCN reaction causing severe rash involving mucus membranes or skin necrosis: No Has patient had a PCN reaction that required hospitalization:No Has patient had a  PCN reaction occurring within the last 10 years:Yes If all of the above answers are "NO", then may proceed with Cephalosporin use.      Social History   Social History  . Marital status: Married    Spouse name: N/A  . Number of children: N/A  . Years of education: N/A   Occupational History  . Carpet cleaner    Social History Main Topics  .  Smoking status: Never Smoker  . Smokeless tobacco: Former User    Types: Chew  . Alcohol use No  . Drug use: No  . Sexual activity: Not on file   Other Topics Concern  . Not on file   Social History Narrative  . No narrative on file    Family History  Problem Relation Age of Onset  . Heart failure Mother     ROS- All systems are reviewed and negative except as per the HPI above.  Physical Exam: Vitals:   08/16/17 1459  BP: 138/74  Pulse: (!) 157  Weight: 279 lb (126.6 kg)  Height: 6' 2" (1.88 m)    GEN- The patient is well appearing, alert and oriented x 3 today.   Head- normocephalic, atraumatic Eyes-  Sclera clear, conjunctiva pink Ears- hearing intact Oropharynx- clear Neck- supple  Lungs- Clear to ausculation bilaterally, normal work of breathing Heart- tachycardic irregular rate and rhythm, no murmurs, rubs or gallops  GI- soft, NT, ND, + BS Extremities- no clubbing, cyanosis, or edema MS- no significant deformity or atrophy Skin- no rash or lesion Psych- euthymic mood, full affect Neuro- strength and sensation are intact  Wt Readings from Last 3 Encounters:  08/16/17 279 lb (126.6 kg)  07/26/17 284 lb 3.2 oz (128.9 kg)  07/25/17 282 lb 12.8 oz (128.3 kg)    EKG today demonstrates atypical atrial flutter, V rate 157  Epic records are reviewed at length today  Assessment and Plan:  1. Persistent atrial fibrillation/atrial flutter He had a normal heart rate yesterday and is realtively asymptomatic today Will plan to increase coreg for now and have him follow BP and HR at home Will be unable to do CT scan with tachycardia TEE scheduled for 11/7 at 9AM.   Continue Xarelto for CHADS2VASC of 1 Proceed with afib ablation next week as scheduled. If he develops worsening symptoms, he will return to the ER over the weekend.  2.  HTN Stable No change required today    Co Sign: Gracyn Santillanes, MD 08/16/2017 3:46 PM   

## 2017-08-16 NOTE — Progress Notes (Signed)
Primary Care Physician: Patient, No Pcp Per Primary Electrophysiologist: Irie Dowson  Russell Arnold is a 56 y.o. male with a history of persistent atrial fibrillation and atrial flutter who presents for follow up in the [facility]. He was scheduled for cardiac CT scan today prior to ablation.  On arrival, his heart rate was found to be 150's and he was referred to be seen for further evaluation.  He is pending ablation 08/22/17.  He reports that yesterday he had some dizzy spells and his blood pressure and HR was normal.  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 He drank extra fluids yesterday and felt good today without awareness of AF on arrival. Today, he  denies symptoms of palpitations, chest pain, shortness of breath, orthopnea, PND, lower extremity edema, dizziness, presyncope, syncope, snoring, daytime somnolence, bleeding, or neurologic sequela. The patient is tolerating medications without difficulties and is otherwise without complaint today.    Past Medical History:  Diagnosis Date  . A-fib (HCC)   . Acute diastolic heart failure (HCC) 05/2016  . Family history of adverse reaction to anesthesia   . Hypertension    Past Surgical History:  Procedure Laterality Date  . ABLATION OF DYSRHYTHMIC FOCUS  09/04/2016  . APPENDECTOMY    . CARDIOVERSION N/A 06/14/2016   Procedure: CARDIOVERSION;  Surgeon: Peter M Swaziland, MD;  Location: [facility] ENDOSCOPY;  Service: Cardiovascular;  Laterality: N/A;  . CARDIOVERSION N/A 06/17/2016   Procedure: CARDIOVERSION;  Surgeon: Dolores Patty, MD;  Location: [facility] OR;  Service: Cardiovascular;  Laterality: N/A;  . ELECTROPHYSIOLOGIC STUDY N/A 09/04/2016   Procedure: Atrial Fibrillation Ablation;  Surgeon: Hillis Range, MD;  Location: [facility] INVASIVE CV LAB;  Service: Cardiovascular;  Laterality: N/A;  . TEE WITHOUT CARDIOVERSION N/A 09/03/2016   Procedure: TRANSESOPHAGEAL ECHOCARDIOGRAM  (TEE);  Surgeon: Thurmon Fair, MD;  Location: [facility] ENDOSCOPY;  Service: Cardiovascular;  Laterality: N/A;    Current Outpatient Prescriptions  Medication Sig Dispense Refill  . acetaminophen (TYLENOL) 500 MG tablet Take 1,000 mg by mouth every 6 (six) hours as needed (for muscle aches/headaches.).    Marland Kitchen carvedilol (COREG) 12.5 MG tablet Take 1 tablet (12.5 mg total) by mouth 2 (two) times daily with a meal. 60 tablet 3  . cholecalciferol (VITAMIN D) 1000 units tablet Take 1,000 Units by mouth daily.    Marland Kitchen ibuprofen (ADVIL,MOTRIN) 200 MG tablet Take 400 mg by mouth every 8 (eight) hours as needed (for headaches/muscle aches.).    Marland Kitchen losartan (COZAAR) 50 MG tablet Take 1 tablet (50 mg total) by mouth daily. 90 tablet 3  . oxymetazoline (AFRIN) 0.05 % nasal spray Place 1 spray into both nostrils 2 (two) times daily as needed for congestion.    . rivaroxaban (XARELTO) 20 MG TABS tablet Take 1 tablet (20 mg total) by mouth daily with supper. 30 tablet 3  . torsemide (DEMADEX) 20 MG tablet Take 10 mg by mouth daily as needed (for weight gain/fluid retention.).      No current facility-administered medications for this encounter.     Allergies  Allergen Reactions  . Amoxicillin Hives    Hives - noted "red splotches" - also ineffective per pt Has patient had a PCN reaction causing immediate rash, facial/tongue/throat swelling, SOB or lightheadedness with hypotension: No Has patient had a PCN reaction causing severe rash involving mucus membranes or skin necrosis: No Has patient had a PCN reaction that required hospitalization:No Has patient had a  PCN reaction occurring within the last 10 years:Yes If all of the above answers are "NO", then may proceed with Cephalosporin use.      Social History   Social History  . Marital status: Married    Spouse name: N/A  . Number of children: N/A  . Years of education: N/A   Occupational History  . Company secretaryCarpet cleaner    Social History Main Topics  .  Smoking status: Never Smoker  . Smokeless tobacco: Former NeurosurgeonUser    Types: Chew  . Alcohol use No  . Drug use: No  . Sexual activity: Not on file   Other Topics Concern  . Not on file   Social History Narrative  . No narrative on file    Family History  Problem Relation Age of Onset  . Heart failure Mother     ROS- All systems are reviewed and negative except as per the HPI above.  Physical Exam: Vitals:   08/16/17 1459  BP: 138/74  Pulse: (!) 157  Weight: 279 lb (126.6 kg)  Height: 6\' 2"  (1.88 m)    GEN- The patient is well appearing, alert and oriented x 3 today.   Head- normocephalic, atraumatic Eyes-  Sclera clear, conjunctiva pink Ears- hearing intact Oropharynx- clear Neck- supple  Lungs- Clear to ausculation bilaterally, normal work of breathing Heart- tachycardic irregular rate and rhythm, no murmurs, rubs or gallops  GI- soft, NT, ND, + BS Extremities- no clubbing, cyanosis, or edema MS- no significant deformity or atrophy Skin- no rash or lesion Psych- euthymic mood, full affect Neuro- strength and sensation are intact  Wt Readings from Last 3 Encounters:  08/16/17 279 lb (126.6 kg)  07/26/17 284 lb 3.2 oz (128.9 kg)  07/25/17 282 lb 12.8 oz (128.3 kg)    EKG today demonstrates atypical atrial flutter, V rate 157  Epic records are reviewed at length today  Assessment and Plan:  1. Persistent atrial fibrillation/atrial flutter He had a normal heart rate yesterday and is realtively asymptomatic today Will plan to increase coreg for now and have him follow BP and HR at home Will be unable to do CT scan with tachycardia TEE scheduled for 11/7 at 9AM.   Continue Xarelto for CHADS2VASC of 1 Proceed with afib ablation next week as scheduled. If he develops worsening symptoms, he will return to the ER over the weekend.  2.  HTN Stable No change required today    Co Sign: Hillis RangeJames Lorena Clearman, MD 08/16/2017 3:46 PM

## 2017-08-16 NOTE — H&P (View-Only) (Signed)
Primary Care Physician: Patient, No Pcp Per Primary Electrophysiologist: Anhar Mcdermott  Russell Arnold is a 56 y.o. male with a history of persistent atrial fibrillation and atrial flutter who presents for follow up in the [facility]. He was scheduled for cardiac CT scan today prior to ablation.  On arrival, his heart rate was found to be 150's and he was referred to be seen for further evaluation.  He is pending ablation 08/22/17.  He reports that yesterday he had some dizzy spells and his blood pressure and HR was normal.  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 He drank extra fluids yesterday and felt good today without awareness of AF on arrival. Today, he  denies symptoms of palpitations, chest pain, shortness of breath, orthopnea, PND, lower extremity edema, dizziness, presyncope, syncope, snoring, daytime somnolence, bleeding, or neurologic sequela. The patient is tolerating medications without difficulties and is otherwise without complaint today.    Past Medical History:  Diagnosis Date  . A-fib (HCC)   . Acute diastolic heart failure (HCC) 05/2016  . Family history of adverse reaction to anesthesia   . Hypertension    Past Surgical History:  Procedure Laterality Date  . ABLATION OF DYSRHYTHMIC FOCUS  09/04/2016  . APPENDECTOMY    . CARDIOVERSION N/A 06/14/2016   Procedure: CARDIOVERSION;  Surgeon: Peter M Swaziland, MD;  Location: [facility] ENDOSCOPY;  Service: Cardiovascular;  Laterality: N/A;  . CARDIOVERSION N/A 06/17/2016   Procedure: CARDIOVERSION;  Surgeon: Dolores Patty, MD;  Location: [facility] OR;  Service: Cardiovascular;  Laterality: N/A;  . ELECTROPHYSIOLOGIC STUDY N/A 09/04/2016   Procedure: Atrial Fibrillation Ablation;  Surgeon: Hillis Range, MD;  Location: [facility] INVASIVE CV LAB;  Service: Cardiovascular;  Laterality: N/A;  . TEE WITHOUT CARDIOVERSION N/A 09/03/2016   Procedure: TRANSESOPHAGEAL ECHOCARDIOGRAM  (TEE);  Surgeon: Thurmon Fair, MD;  Location: [facility] ENDOSCOPY;  Service: Cardiovascular;  Laterality: N/A;    Current Outpatient Prescriptions  Medication Sig Dispense Refill  . acetaminophen (TYLENOL) 500 MG tablet Take 1,000 mg by mouth every 6 (six) hours as needed (for muscle aches/headaches.).    Marland Kitchen carvedilol (COREG) 12.5 MG tablet Take 1 tablet (12.5 mg total) by mouth 2 (two) times daily with a meal. 60 tablet 3  . cholecalciferol (VITAMIN D) 1000 units tablet Take 1,000 Units by mouth daily.    Marland Kitchen ibuprofen (ADVIL,MOTRIN) 200 MG tablet Take 400 mg by mouth every 8 (eight) hours as needed (for headaches/muscle aches.).    Marland Kitchen losartan (COZAAR) 50 MG tablet Take 1 tablet (50 mg total) by mouth daily. 90 tablet 3  . oxymetazoline (AFRIN) 0.05 % nasal spray Place 1 spray into both nostrils 2 (two) times daily as needed for congestion.    . rivaroxaban (XARELTO) 20 MG TABS tablet Take 1 tablet (20 mg total) by mouth daily with supper. 30 tablet 3  . torsemide (DEMADEX) 20 MG tablet Take 10 mg by mouth daily as needed (for weight gain/fluid retention.).      No current facility-administered medications for this encounter.     Allergies  Allergen Reactions  . Amoxicillin Hives    Hives - noted "red splotches" - also ineffective per pt Has patient had a PCN reaction causing immediate rash, facial/tongue/throat swelling, SOB or lightheadedness with hypotension: No Has patient had a PCN reaction causing severe rash involving mucus membranes or skin necrosis: No Has patient had a PCN reaction that required hospitalization:No Has patient had a  PCN reaction occurring within the last 10 years:Yes If all of the above answers are "NO", then may proceed with Cephalosporin use.      Social History   Social History  . Marital status: Married    Spouse name: N/A  . Number of children: N/A  . Years of education: N/A   Occupational History  . Carpet cleaner    Social History Main Topics  .  Smoking status: Never Smoker  . Smokeless tobacco: Former User    Types: Chew  . Alcohol use No  . Drug use: No  . Sexual activity: Not on file   Other Topics Concern  . Not on file   Social History Narrative  . No narrative on file    Family History  Problem Relation Age of Onset  . Heart failure Mother     ROS- All systems are reviewed and negative except as per the HPI above.  Physical Exam: Vitals:   08/16/17 1459  BP: 138/74  Pulse: (!) 157  Weight: 279 lb (126.6 kg)  Height: 6' 2" (1.88 m)    GEN- The patient is well appearing, alert and oriented x 3 today.   Head- normocephalic, atraumatic Eyes-  Sclera clear, conjunctiva pink Ears- hearing intact Oropharynx- clear Neck- supple  Lungs- Clear to ausculation bilaterally, normal work of breathing Heart- tachycardic irregular rate and rhythm, no murmurs, rubs or gallops  GI- soft, NT, ND, + BS Extremities- no clubbing, cyanosis, or edema MS- no significant deformity or atrophy Skin- no rash or lesion Psych- euthymic mood, full affect Neuro- strength and sensation are intact  Wt Readings from Last 3 Encounters:  08/16/17 279 lb (126.6 kg)  07/26/17 284 lb 3.2 oz (128.9 kg)  07/25/17 282 lb 12.8 oz (128.3 kg)    EKG today demonstrates atypical atrial flutter, V rate 157  Epic records are reviewed at length today  Assessment and Plan:  1. Persistent atrial fibrillation/atrial flutter He had a normal heart rate yesterday and is realtively asymptomatic today Will plan to increase coreg for now and have him follow BP and HR at home Will be unable to do CT scan with tachycardia TEE scheduled for 11/7 at 9AM.   Continue Xarelto for CHADS2VASC of 1 Proceed with afib ablation next week as scheduled. If he develops worsening symptoms, he will return to the ER over the weekend.  2.  HTN Stable No change required today    Co Sign: Kyrra Prada, MD 08/16/2017 3:46 PM   

## 2017-08-16 NOTE — Patient Instructions (Addendum)
TEE scheduled for Wednesday, November 7th  - Arrive at the [facility] and go to admitting at 7:30AM  -Do not eat or drink anything after midnight the night prior to your procedure.  - Take all your medication with a sip of water prior to arrival.  - You will not be able to drive home after your procedure.   Increase coreg to 25mg  twice a day

## 2017-08-21 ENCOUNTER — Encounter (HOSPITAL_COMMUNITY): Payer: Self-pay | Admitting: *Deleted

## 2017-08-21 ENCOUNTER — Encounter (HOSPITAL_COMMUNITY)
Admission: RE | Disposition: A | Discharge status: Home / Self Care | Payer: Self-pay | Source: Ambulatory Visit | Attending: Cardiovascular Disease

## 2017-08-21 ENCOUNTER — Ambulatory Visit (HOSPITAL_COMMUNITY)
Admission: RE | Admit: 2017-08-21 | Discharge: 2017-08-21 | Disposition: A | Discharge status: Home / Self Care | Payer: BLUE CROSS/BLUE SHIELD | Source: Ambulatory Visit | Attending: Cardiovascular Disease | Admitting: Cardiovascular Disease

## 2017-08-21 ENCOUNTER — Ambulatory Visit (HOSPITAL_BASED_OUTPATIENT_CLINIC_OR_DEPARTMENT_OTHER)
Admission: RE | Admit: 2017-08-21 | Discharge: 2017-08-21 | Disposition: A | Discharge status: Home / Self Care | Payer: BLUE CROSS/BLUE SHIELD | Source: Ambulatory Visit | Attending: Nurse Practitioner | Admitting: Nurse Practitioner

## 2017-08-21 DIAGNOSIS — Z7901 Long term (current) use of anticoagulants: Secondary | ICD-10-CM | POA: Insufficient documentation

## 2017-08-21 DIAGNOSIS — I481 Persistent atrial fibrillation: Secondary | ICD-10-CM | POA: Insufficient documentation

## 2017-08-21 DIAGNOSIS — I4892 Unspecified atrial flutter: Secondary | ICD-10-CM | POA: Insufficient documentation

## 2017-08-21 DIAGNOSIS — I5032 Chronic diastolic (congestive) heart failure: Secondary | ICD-10-CM | POA: Diagnosis not present

## 2017-08-21 DIAGNOSIS — Z79899 Other long term (current) drug therapy: Secondary | ICD-10-CM | POA: Diagnosis not present

## 2017-08-21 DIAGNOSIS — I110 Hypertensive heart disease with heart failure: Secondary | ICD-10-CM | POA: Diagnosis not present

## 2017-08-21 DIAGNOSIS — I4891 Unspecified atrial fibrillation: Secondary | ICD-10-CM | POA: Diagnosis not present

## 2017-08-21 DIAGNOSIS — I480 Paroxysmal atrial fibrillation: Secondary | ICD-10-CM

## 2017-08-21 HISTORY — PX: TEE WITHOUT CARDIOVERSION: SHX5443

## 2017-08-21 SURGERY — ECHOCARDIOGRAM, TRANSESOPHAGEAL
Anesthesia: Moderate Sedation

## 2017-08-21 MED ORDER — FENTANYL CITRATE (PF) 100 MCG/2ML IJ SOLN
INTRAMUSCULAR | Status: AC
  Filled 2017-08-21: qty 2

## 2017-08-21 MED ORDER — MIDAZOLAM HCL 5 MG/ML IJ SOLN
INTRAMUSCULAR | Status: AC
  Filled 2017-08-21: qty 2

## 2017-08-21 MED ORDER — BUTAMBEN-TETRACAINE-BENZOCAINE 2-2-14 % EX AERO
INHALATION_SPRAY | CUTANEOUS | Status: DC | PRN
  Administered 2017-08-21: 2 via TOPICAL

## 2017-08-21 MED ORDER — SODIUM CHLORIDE 0.9 % IV SOLN
INTRAVENOUS | Status: DC
  Administered 2017-08-21: 500 mL via INTRAVENOUS

## 2017-08-21 MED ORDER — MIDAZOLAM HCL 10 MG/2ML IJ SOLN
INTRAMUSCULAR | Status: DC | PRN
  Administered 2017-08-21 (×2): 1 mg via INTRAVENOUS
  Administered 2017-08-21 (×2): 2 mg via INTRAVENOUS

## 2017-08-21 MED ORDER — FENTANYL CITRATE (PF) 100 MCG/2ML IJ SOLN
INTRAMUSCULAR | Status: DC | PRN
  Administered 2017-08-21: 50 ug via INTRAVENOUS
  Administered 2017-08-21 (×2): 25 ug via INTRAVENOUS

## 2017-08-21 NOTE — Discharge Instructions (Signed)

## 2017-08-21 NOTE — Anesthesia Preprocedure Evaluation (Addendum)
Anesthesia Evaluation  Patient identified by MRN, date of birth, ID band Patient awake    Reviewed: Allergy & Precautions, NPO status , Patient's Chart, lab work & pertinent test results  History of Anesthesia Complications Negative for: history of anesthetic complications  Airway Mallampati: III  TM Distance: >3 FB Neck ROM: Full    Dental  (+) Dental Advisory Given, Chipped   Pulmonary Recent URI ,    breath sounds clear to auscultation       Cardiovascular hypertension, Pt. on medications and Pt. on home beta blockers + dysrhythmias Atrial Fibrillation  Rhythm:Regular Rate:Normal  08/21/17 ECHO: EF 55-60%, valves OK   Neuro/Psych negative neurological ROS     GI/Hepatic negative GI ROS, Neg liver ROS,   Endo/Other  Morbid obesity  Renal/GU negative Renal ROS     Musculoskeletal   Abdominal (+) + obese,   Peds  Hematology Xarelto   Anesthesia Other Findings   Reproductive/Obstetrics                           Anesthesia Physical Anesthesia Plan  ASA: III  Anesthesia Plan: General   Post-op Pain Management:    Induction: Intravenous  PONV Risk Score and Plan: 2 and Ondansetron, Dexamethasone and Midazolam  Airway Management Planned: Oral ETT and Video Laryngoscope Planned  Additional Equipment:   Intra-op Plan:   Post-operative Plan: Extubation in OR  Informed Consent: I have reviewed the patients History and Physical, chart, labs and discussed the procedure including the risks, benefits and alternatives for the proposed anesthesia with the patient or authorized representative who has indicated his/her understanding and acceptance.   Dental advisory given  Plan Discussed with: CRNA and Surgeon  Anesthesia Plan Comments: (Plan routine monitors, GETA with VideoGlide intubation)        Anesthesia Quick Evaluation

## 2017-08-21 NOTE — Progress Notes (Signed)
  Echocardiogram Echocardiogram Transesophageal has been performed.  Leta Jungling M 08/21/2017, 9:34 AM

## 2017-08-21 NOTE — CV Procedure (Signed)
During this procedure the patient is administered a total of Versed 8 mg and Fentanyl 100 mg to achieve and maintain moderate conscious sedation.  The patient's heart rate, blood pressure, and oxygen saturation are monitored continuously during the procedure. The period of conscious sedation is 30 minutes, of which I was present face-to-face 100% of this time.  No LAA Thrombus Normal valves Normal RV No effusion Negative bubble No aortic debris  Ok for ablation in am  [employer]

## 2017-08-21 NOTE — Interval H&P Note (Signed)
History and Physical Interval Note:  08/21/2017 7:49 AM  Russell Arnold  has presented today for surgery, with the diagnosis of pre-ablation  The various methods of treatment have been discussed with the patient and family. After consideration of risks, benefits and other options for treatment, the patient has consented to  Procedure(s): TRANSESOPHAGEAL ECHOCARDIOGRAM (TEE) (N/A) as a surgical intervention .  The patient's history has been reviewed, patient examined, no change in status, stable for surgery.  I have reviewed the patient's chart and labs.  Questions were answered to the patient's satisfaction.     Charlton Haws

## 2017-08-22 ENCOUNTER — Ambulatory Visit (HOSPITAL_COMMUNITY): Payer: BLUE CROSS/BLUE SHIELD | Admitting: Anesthesiology

## 2017-08-22 ENCOUNTER — Other Ambulatory Visit: Payer: Self-pay

## 2017-08-22 ENCOUNTER — Ambulatory Visit (HOSPITAL_COMMUNITY)
Admission: RE | Admit: 2017-08-22 | Discharge: 2017-08-22 | Disposition: A | Discharge status: Home / Self Care | Payer: BLUE CROSS/BLUE SHIELD | Source: Ambulatory Visit | Attending: Internal Medicine | Admitting: Internal Medicine

## 2017-08-22 ENCOUNTER — Encounter (HOSPITAL_COMMUNITY): Payer: Self-pay | Admitting: Certified Registered Nurse Anesthetist

## 2017-08-22 ENCOUNTER — Encounter (HOSPITAL_COMMUNITY)
Admission: RE | Disposition: A | Discharge status: Home / Self Care | Payer: Self-pay | Source: Ambulatory Visit | Attending: Internal Medicine

## 2017-08-22 DIAGNOSIS — I110 Hypertensive heart disease with heart failure: Secondary | ICD-10-CM | POA: Insufficient documentation

## 2017-08-22 DIAGNOSIS — Z79899 Other long term (current) drug therapy: Secondary | ICD-10-CM | POA: Diagnosis not present

## 2017-08-22 DIAGNOSIS — I4819 Other persistent atrial fibrillation: Secondary | ICD-10-CM | POA: Diagnosis present

## 2017-08-22 DIAGNOSIS — Z880 Allergy status to penicillin: Secondary | ICD-10-CM | POA: Insufficient documentation

## 2017-08-22 DIAGNOSIS — I4892 Unspecified atrial flutter: Secondary | ICD-10-CM | POA: Insufficient documentation

## 2017-08-22 DIAGNOSIS — Z7901 Long term (current) use of anticoagulants: Secondary | ICD-10-CM | POA: Diagnosis not present

## 2017-08-22 DIAGNOSIS — I4891 Unspecified atrial fibrillation: Secondary | ICD-10-CM | POA: Diagnosis not present

## 2017-08-22 DIAGNOSIS — I481 Persistent atrial fibrillation: Secondary | ICD-10-CM | POA: Insufficient documentation

## 2017-08-22 DIAGNOSIS — I5032 Chronic diastolic (congestive) heart failure: Secondary | ICD-10-CM | POA: Insufficient documentation

## 2017-08-22 HISTORY — PX: ATRIAL FIBRILLATION ABLATION: EP1191

## 2017-08-22 LAB — POCT ACTIVATED CLOTTING TIME
ACTIVATED CLOTTING TIME: 279 s
Activated Clotting Time: 158 seconds

## 2017-08-22 SURGERY — ATRIAL FIBRILLATION ABLATION
Anesthesia: General

## 2017-08-22 MED ORDER — PROTAMINE SULFATE 10 MG/ML IV SOLN
INTRAVENOUS | Status: DC | PRN
  Administered 2017-08-22: 30 mg via INTRAVENOUS

## 2017-08-22 MED ORDER — SODIUM CHLORIDE 0.9% FLUSH: 3.0000 mL | INTRAVENOUS | Status: DC | PRN

## 2017-08-22 MED ORDER — SODIUM CHLORIDE 0.9 % IV SOLN: 250.0000 mL | INTRAVENOUS | Status: DC | PRN

## 2017-08-22 MED ORDER — HEPARIN SODIUM (PORCINE) 1000 UNIT/ML IJ SOLN
INTRAMUSCULAR | Status: DC | PRN
  Administered 2017-08-22: 3000 [IU] via INTRAVENOUS

## 2017-08-22 MED ORDER — ONDANSETRON HCL 4 MG/2ML IJ SOLN
INTRAMUSCULAR | Status: DC | PRN
  Administered 2017-08-22: 4 mg via INTRAVENOUS

## 2017-08-22 MED ORDER — PANTOPRAZOLE SODIUM 40 MG PO TBEC: 40.0000 mg | DELAYED_RELEASE_TABLET | Freq: Every day | ORAL | 0 refills | Status: DC

## 2017-08-22 MED ORDER — DEXAMETHASONE SODIUM PHOSPHATE 10 MG/ML IJ SOLN
INTRAMUSCULAR | Status: DC | PRN
  Administered 2017-08-22: 10 mg via INTRAVENOUS

## 2017-08-22 MED ORDER — SODIUM CHLORIDE 0.9% FLUSH
3.0000 mL | Freq: Two times a day (BID) | INTRAVENOUS | Status: DC
  Administered 2017-08-22: 3 mL via INTRAVENOUS

## 2017-08-22 MED ORDER — HYDROCODONE-ACETAMINOPHEN 5-325 MG PO TABS: 1.0000 | ORAL_TABLET | ORAL | Status: DC | PRN

## 2017-08-22 MED ORDER — PHENYLEPHRINE HCL 10 MG/ML IJ SOLN
INTRAVENOUS | Status: DC | PRN
  Administered 2017-08-22: 30 ug/min via INTRAVENOUS

## 2017-08-22 MED ORDER — SODIUM CHLORIDE 0.9 % IV SOLN
INTRAVENOUS | Status: DC
  Administered 2017-08-22: 06:00:00 via INTRAVENOUS

## 2017-08-22 MED ORDER — FENTANYL CITRATE (PF) 250 MCG/5ML IJ SOLN
INTRAMUSCULAR | Status: DC | PRN
  Administered 2017-08-22: 100 ug via INTRAVENOUS

## 2017-08-22 MED ORDER — CARVEDILOL 12.5 MG PO TABS: 12.5000 mg | ORAL_TABLET | Freq: Two times a day (BID) | ORAL | Status: DC

## 2017-08-22 MED ORDER — BUPIVACAINE HCL (PF) 0.25 % IJ SOLN
INTRAMUSCULAR | Status: AC
  Filled 2017-08-22: qty 30

## 2017-08-22 MED ORDER — HEPARIN SODIUM (PORCINE) 1000 UNIT/ML IJ SOLN
INTRAMUSCULAR | Status: DC | PRN
  Administered 2017-08-22: 12000 [IU] via INTRAVENOUS
  Administered 2017-08-22: 1000 [IU] via INTRAVENOUS

## 2017-08-22 MED ORDER — LIDOCAINE 2% (20 MG/ML) 5 ML SYRINGE
INTRAMUSCULAR | Status: DC | PRN
  Administered 2017-08-22: 100 mg via INTRAVENOUS

## 2017-08-22 MED ORDER — IOPAMIDOL (ISOVUE-370) INJECTION 76%
INTRAVENOUS | Status: DC | PRN
  Administered 2017-08-22: 3 mL via INTRAVENOUS

## 2017-08-22 MED ORDER — ONDANSETRON HCL 4 MG/2ML IJ SOLN: 4.0000 mg | Freq: Four times a day (QID) | INTRAMUSCULAR | Status: DC | PRN

## 2017-08-22 MED ORDER — SUCCINYLCHOLINE CHLORIDE 20 MG/ML IJ SOLN
INTRAMUSCULAR | Status: DC | PRN
  Administered 2017-08-22: 200 mg via INTRAVENOUS

## 2017-08-22 MED ORDER — PROPOFOL 10 MG/ML IV BOLUS
INTRAVENOUS | Status: DC | PRN
  Administered 2017-08-22: 40 mg via INTRAVENOUS
  Administered 2017-08-22: 200 mg via INTRAVENOUS

## 2017-08-22 MED ORDER — IOPAMIDOL (ISOVUE-370) INJECTION 76%
INTRAVENOUS | Status: AC
  Filled 2017-08-22: qty 50

## 2017-08-22 MED ORDER — ACETAMINOPHEN 325 MG PO TABS: 650.0000 mg | ORAL_TABLET | ORAL | Status: DC | PRN

## 2017-08-22 MED ORDER — MIDAZOLAM HCL 2 MG/2ML IJ SOLN
INTRAMUSCULAR | Status: DC | PRN
  Administered 2017-08-22 (×2): 1 mg via INTRAVENOUS

## 2017-08-22 MED ORDER — LOSARTAN POTASSIUM 50 MG PO TABS
50.0000 mg | ORAL_TABLET | Freq: Every day | ORAL | Status: DC
Start: 2017-08-23 — End: 2017-08-22

## 2017-08-22 MED ORDER — ISOPROTERENOL HCL 0.2 MG/ML IJ SOLN
INTRAMUSCULAR | Status: AC
  Filled 2017-08-22: qty 5

## 2017-08-22 MED ORDER — RIVAROXABAN 20 MG PO TABS
20.0000 mg | ORAL_TABLET | Freq: Every day | ORAL | Status: DC
  Administered 2017-08-22: 20 mg via ORAL
  Filled 2017-08-22: qty 1

## 2017-08-22 MED ORDER — DEXTROSE 5 % IV SOLN
INTRAVENOUS | Status: DC | PRN
  Administered 2017-08-22: 20 ug/min via INTRAVENOUS

## 2017-08-22 MED ORDER — EPHEDRINE SULFATE-NACL 50-0.9 MG/10ML-% IV SOSY
PREFILLED_SYRINGE | INTRAVENOUS | Status: DC | PRN
  Administered 2017-08-22: 5 mg via INTRAVENOUS

## 2017-08-22 MED ORDER — BUPIVACAINE HCL (PF) 0.25 % IJ SOLN
INTRAMUSCULAR | Status: DC | PRN
  Administered 2017-08-22: 30 mL

## 2017-08-22 SURGICAL SUPPLY — 18 items
BAG SNAP BAND KOVER 36X36 (MISCELLANEOUS) ×2 IMPLANT
BLANKET WARM UNDERBOD FULL ACC (MISCELLANEOUS) ×2 IMPLANT
CATH MAPPNG PENTARAY F 2-6-2MM (CATHETERS) ×1 IMPLANT
CATH NAVISTAR SMARTTOUCH DF (ABLATOR) ×2 IMPLANT
CATH SOUNDSTAR 3D IMAGING (CATHETERS) ×2 IMPLANT
CATH WEBSTER BI DIR CS D-F CRV (CATHETERS) ×2 IMPLANT
NEEDLE BAYLIS TRANSSEPTAL 71CM (NEEDLE) ×2 IMPLANT
NEEDLE TRANSEP BRK 71CM 407200 (NEEDLE) ×2 IMPLANT
PACK EP LATEX FREE (CUSTOM PROCEDURE TRAY) ×1
PACK EP LF (CUSTOM PROCEDURE TRAY) ×1 IMPLANT
PAD DEFIB LIFELINK (PAD) ×2 IMPLANT
PATCH CARTO3 (PAD) ×2 IMPLANT
PENTARAY F 2-6-2MM (CATHETERS) ×2
SHEATH AVANTI 11F 11CM (SHEATH) ×2 IMPLANT
SHEATH PINNACLE 7F 10CM (SHEATH) ×4 IMPLANT
SHEATH PINNACLE 9F 10CM (SHEATH) ×2 IMPLANT
SHEATH SWARTZ TS SL2 63CM 8.5F (SHEATH) ×2 IMPLANT
TUBING SMART ABLATE COOLFLOW (TUBING) ×2 IMPLANT

## 2017-08-22 NOTE — Anesthesia Procedure Notes (Signed)
Procedure Name: Intubation Date/Time: 08/22/2017 8:10 AM Performed by: Burt Ek, CRNA Pre-anesthesia Checklist: Patient identified, Emergency Drugs available, Suction available and Patient being monitored Patient Re-evaluated:Patient Re-evaluated prior to induction Oxygen Delivery Method: Circle system utilized Preoxygenation: Pre-oxygenation with 100% oxygen Induction Type: IV induction and Rapid sequence Laryngoscope Size: Glidescope and 4 Grade View: Grade I Tube type: Oral Tube size: 7.5 mm Number of attempts: 1 Airway Equipment and Method: Video-laryngoscopy and Rigid stylet Placement Confirmation: ETT inserted through vocal cords under direct vision,  positive ETCO2 and breath sounds checked- equal and bilateral Secured at: 23 cm Tube secured with: Tape Dental Injury: Teeth and Oropharynx as per pre-operative assessment

## 2017-08-22 NOTE — Progress Notes (Addendum)
1300 Pt arrived via stretcher from cath lab. Pt A&Ox4. Right groin site CDI, no complications noted, but in NSR. Denies pain, SOB, pt has dry cough, stated he has a "cold". Pt assessed, BP elevated, wife at bedside, updated with POC, WCTM.   1600 Dr. Johney Frame at bedside discharge orders entered. Pt may be discharged after bedrest orders are completed. Ablation site CDI, no hematoma noted, pedal pulse +2, pt still in NSR. Will continue to monitor.   1535 Pt discharged to home, discharge instructions, new meds, wound care instructions given to pt. Pt and wife understand without assistance. INT, tele box removed without complication. Right groin site CDI, not hematoma or complications noted after pt up walking hallway. Pt down to car via wheelchair with all belongings including cell phone.

## 2017-08-22 NOTE — Progress Notes (Addendum)
Site area: RFA x 3 Site Prior to Removal:  Level 0 Pressure Applied For: 20 min Manual: yes   Patient Status During Pull:  stable Post Pull Site:  Level 0 Post Pull Instructions Given:   Post Pull Pulses Present: palpable Dressing Applied:  tegaderm Bedrest begins @  Comments: 1120 till 1720

## 2017-08-22 NOTE — Transfer of Care (Signed)
Immediate Anesthesia Transfer of Care Note  Patient: Russell Arnold  Procedure(s) Performed: ATRIAL FIBRILLATION ABLATION (N/A )  Patient Location: Cath Lab  Anesthesia Type:General  Level of Consciousness: awake, alert , oriented and patient cooperative  Airway & Oxygen Therapy: Patient Spontanous Breathing and Patient connected to nasal cannula oxygen  Post-op Assessment: Report given to RN, Post -op Vital signs reviewed and stable and Patient moving all extremities X 4  Post vital signs: Reviewed and stable  Last Vitals:  Vitals:   08/22/17 0550  BP: (!) 147/99  Pulse: (!) 59  Temp: 36.4 C  SpO2: 99%    Last Pain:  Vitals:   08/22/17 0550  TempSrc: Oral      Patients Stated Pain Goal: 3 (08/22/17 0553)  Complications: No apparent anesthesia complications

## 2017-08-22 NOTE — Anesthesia Postprocedure Evaluation (Signed)
Anesthesia Post Note  Patient: Russell Arnold  Procedure(s) Performed: ATRIAL FIBRILLATION ABLATION (N/A )     Patient location during evaluation: PACU Anesthesia Type: General Level of consciousness: awake Pain management: pain level controlled Vital Signs Assessment: post-procedure vital signs reviewed and stable Respiratory status: spontaneous breathing Cardiovascular status: stable Anesthetic complications: no    Last Vitals:  Vitals:   08/22/17 1220 08/22/17 1225  BP:  (!) 157/94  Pulse: 62 68  Resp: 15 19  Temp:    SpO2: 98% 99%    Last Pain:  Vitals:   08/22/17 1118  TempSrc: Temporal                 Sapna Padron

## 2017-08-22 NOTE — Interval H&P Note (Signed)
History and Physical Interval Note:  08/22/2017 7:28 AM  Russell Arnold  has presented today for surgery, with the diagnosis of afib  The various methods of treatment have been discussed with the patient and family. After consideration of risks, benefits and other options for treatment, the patient has consented to  Procedure(s): ATRIAL FIBRILLATION ABLATION (N/A) as a surgical intervention .  The patient's history has been reviewed, patient examined, no change in status, stable for surgery.  I have reviewed the patient's chart and labs.  Questions were answered to the patient's satisfaction.    Pt has a URI.  Discussed with Dr Jean Rosenthal.  Will use ETT and proceed with caution.  TEE reviewed with the patient.  He reports compliance with anticoagulation without interruption.  Hillis Range

## 2017-08-23 ENCOUNTER — Encounter (HOSPITAL_COMMUNITY): Payer: Self-pay | Admitting: Cardiovascular Disease

## 2017-09-16 ENCOUNTER — Ambulatory Visit: Payer: BLUE CROSS/BLUE SHIELD | Admitting: Internal Medicine

## 2017-09-20 ENCOUNTER — Ambulatory Visit (HOSPITAL_COMMUNITY)
Admission: RE | Admit: 2017-09-20 | Discharge: 2017-09-20 | Disposition: A | Discharge status: Home / Self Care | Payer: BLUE CROSS/BLUE SHIELD | Source: Ambulatory Visit | Attending: Nurse Practitioner | Admitting: Nurse Practitioner

## 2017-09-20 ENCOUNTER — Encounter (HOSPITAL_COMMUNITY): Payer: Self-pay | Admitting: Nurse Practitioner

## 2017-09-20 VITALS — BP 128/74 | HR 72 | Ht 74.0 in | Wt 288.0 lb

## 2017-09-20 DIAGNOSIS — I481 Persistent atrial fibrillation: Secondary | ICD-10-CM

## 2017-09-20 DIAGNOSIS — Z880 Allergy status to penicillin: Secondary | ICD-10-CM | POA: Insufficient documentation

## 2017-09-20 DIAGNOSIS — I4891 Unspecified atrial fibrillation: Secondary | ICD-10-CM | POA: Diagnosis present

## 2017-09-20 DIAGNOSIS — Z9889 Other specified postprocedural states: Secondary | ICD-10-CM | POA: Diagnosis not present

## 2017-09-20 DIAGNOSIS — Z7901 Long term (current) use of anticoagulants: Secondary | ICD-10-CM | POA: Diagnosis not present

## 2017-09-20 DIAGNOSIS — I10 Essential (primary) hypertension: Secondary | ICD-10-CM | POA: Insufficient documentation

## 2017-09-20 DIAGNOSIS — I4819 Other persistent atrial fibrillation: Secondary | ICD-10-CM

## 2017-09-20 DIAGNOSIS — Z79899 Other long term (current) drug therapy: Secondary | ICD-10-CM | POA: Diagnosis not present

## 2017-09-20 NOTE — Progress Notes (Signed)
Primary Care Physician: Patient, No Pcp Per Referring Physician: Dr. Joesphine Bare Russell Arnold is a 56 y.o. male with a h/o persistent afib that underwent second ablation 11/8. He is in the afib clinic for f/u and  Feels very well. No issues with irregular rhythm since ablation. No swallowing or groin issues.   Today, he denies symptoms of palpitations, chest pain, shortness of breath, orthopnea, PND, lower extremity edema, dizziness, presyncope, syncope, or neurologic sequela. The patient is tolerating medications without difficulties and is otherwise without complaint today.   Past Medical History:  Diagnosis Date  . A-fib (HCC)   . Acute diastolic heart failure (HCC) 05/2016  . Family history of adverse reaction to anesthesia   . Hypertension    Past Surgical History:  Procedure Laterality Date  . ABLATION OF DYSRHYTHMIC FOCUS  09/04/2016  . APPENDECTOMY    . ATRIAL FIBRILLATION ABLATION N/A 08/22/2017   Procedure: ATRIAL FIBRILLATION ABLATION;  Surgeon: Hillis Range, MD;  [facility]: [facility] INVASIVE CV LAB;  Service: Cardiovascular;  Laterality: N/A;  . CARDIOVERSION N/A 06/14/2016   Procedure: CARDIOVERSION;  Surgeon: Peter M Swaziland, MD;  Location: [facility] ENDOSCOPY;  Service: Cardiovascular;  Laterality: N/A;  . CARDIOVERSION N/A 06/17/2016   Procedure: CARDIOVERSION;  Surgeon: Dolores Patty, MD;  Location: [facility] OR;  Service: Cardiovascular;  Laterality: N/A;  . ELECTROPHYSIOLOGIC STUDY N/A 09/04/2016   Procedure: Atrial Fibrillation Ablation;  Surgeon: Hillis Range, MD;  Location: [facility] INVASIVE CV LAB;  Service: Cardiovascular;  Laterality: N/A;  . TEE WITHOUT CARDIOVERSION N/A 09/03/2016   Procedure: TRANSESOPHAGEAL ECHOCARDIOGRAM (TEE);  Surgeon: Thurmon Fair, MD;  Location: [facility] ENDOSCOPY;  Service: Cardiovascular;  Laterality: N/A;  . TEE WITHOUT CARDIOVERSION N/A 08/21/2017   Procedure: TRANSESOPHAGEAL ECHOCARDIOGRAM (TEE);  Surgeon: Wendall Stade, MD;  Location: [facility]  ENDOSCOPY;  Service: Cardiovascular;  Laterality: N/A;    Current Outpatient Medications  Medication Sig Dispense Refill  . carvedilol (COREG) 25 MG tablet Take 1 tablet (25 mg total) by mouth 2 (two) times daily with a meal. 60 tablet 3  . cholecalciferol (VITAMIN D) 1000 units tablet Take 1,000 Units by mouth daily.    Marland Kitchen losartan (COZAAR) 50 MG tablet Take 1 tablet (50 mg total) by mouth daily. 90 tablet 3  . rivaroxaban (XARELTO) 20 MG TABS tablet Take 1 tablet (20 mg total) by mouth daily with supper. 30 tablet 3  . acetaminophen (TYLENOL) 500 MG tablet Take 1,000 mg by mouth every 6 (six) hours as needed (for muscle aches/headaches.).    Marland Kitchen ibuprofen (ADVIL,MOTRIN) 200 MG tablet Take 400 mg by mouth every 8 (eight) hours as needed (for headaches/muscle aches.).    Marland Kitchen oxymetazoline (AFRIN) 0.05 % nasal spray Place 1 spray into both nostrils 2 (two) times daily as needed for congestion.    . pantoprazole (PROTONIX) 40 MG tablet Take 1 tablet (40 mg total) daily by mouth. (Patient not taking: Reported on 09/20/2017) 45 tablet 0   No current facility-administered medications for this encounter.     Allergies  Allergen Reactions  . Amoxicillin Hives    Hives - noted "red splotches" - also ineffective per pt Has patient had a PCN reaction causing immediate rash, facial/tongue/throat swelling, SOB or lightheadedness with hypotension: No Has patient had a PCN reaction causing severe rash involving mucus membranes or skin necrosis: No Has patient had a PCN reaction that required hospitalization:No Has patient had a PCN reaction occurring within the last 10 years:Yes If all of the above answers  are "NO", then may proceed with Cephalosporin use.      Social History   Socioeconomic History  . Marital status: Married    Spouse name: Not on file  . Number of children: Not on file  . Years of education: Not on file  . Highest education level: Not on file  Social Needs  . Financial resource  strain: Not on file  . Food insecurity - worry: Not on file  . Food insecurity - inability: Not on file  . Transportation needs - medical: Not on file  . Transportation needs - non-medical: Not on file  Occupational History  . Occupation: Company secretaryCarpet cleaner  Tobacco Use  . Smoking status: Never Smoker  . Smokeless tobacco: Former NeurosurgeonUser    Types: Chew  Substance and Sexual Activity  . Alcohol use: No  . Drug use: No  . Sexual activity: Not on file  Other Topics Concern  . Not on file  Social History Narrative  . Not on file    Family History  Problem Relation Age of Onset  . Heart failure Mother     ROS- All systems are reviewed and negative except as per the HPI above  Physical Exam: Vitals:   09/20/17 0924  BP: 128/74  Pulse: 72  Weight: 288 lb (130.6 kg)  Height: 6\' 2"  (1.88 m)   Wt Readings from Last 3 Encounters:  09/20/17 288 lb (130.6 kg)  08/22/17 286 lb 3.2 oz (129.8 kg)  08/21/17 279 lb (126.6 kg)    Labs: Lab Results  Component Value Date   NA 137 08/16/2017   K 4.1 08/16/2017   CL 104 08/16/2017   CO2 24 08/16/2017   GLUCOSE 106 (H) 08/16/2017   BUN 13 08/16/2017   CREATININE 1.06 08/16/2017   CALCIUM 9.4 08/16/2017   MG 1.9 08/16/2017   No results found for: INR Lab Results  Component Value Date   CHOL 124 06/14/2016   HDL 54 06/14/2016   LDLCALC 60 06/14/2016   TRIG 48 06/14/2016     GEN- The patient is well appearing, alert and oriented x 3 today.   Head- normocephalic, atraumatic Eyes-  Sclera clear, conjunctiva pink Ears- hearing intact Oropharynx- clear Neck- supple, no JVP Lymph- no cervical lymphadenopathy Lungs- Clear to ausculation bilaterally, normal work of breathing Heart- Regular rate and rhythm, no murmurs, rubs or gallops, PMI not laterally displaced GI- soft, NT, ND, + BS Extremities- no clubbing, cyanosis, or edema MS- no significant deformity or atrophy Skin- no rash or lesion Psych- euthymic mood, full  affect Neuro- strength and sensation are intact  EKG-NSR at 72 bpm, pr int 166 ms, qrs int 94 ms, qtc 444 Epic records reviewed      Assessment and Plan: 1. Persistent afib S/p ablation and is maintaining SR Continue carvedilol 25 mg bid   Continue xarelto 20 mg qd for chadsvasc score of at least 2, no interruption of anticoagulation for the 3 month time following ablation He has joined a gym and was encouraged to move into exercise gradually  2. HTN Stable  Stevan Eberwein C. Matthew Folksarroll, ANP-C Afib [facility] [address] [place], [place] [id number] [phone]

## 2017-10-04 NOTE — Addendum Note (Signed)
Encounter addended by: Newman Nip, NP on: 10/04/2017 2:54 PM  Actions taken: LOS modified

## 2017-12-02 ENCOUNTER — Encounter: Payer: Self-pay | Admitting: Internal Medicine

## 2017-12-02 ENCOUNTER — Ambulatory Visit: Payer: BLUE CROSS/BLUE SHIELD | Admitting: Internal Medicine

## 2017-12-02 VITALS — BP 124/78 | HR 64 | Ht 74.0 in | Wt 285.0 lb

## 2017-12-02 DIAGNOSIS — I481 Persistent atrial fibrillation: Secondary | ICD-10-CM

## 2017-12-02 DIAGNOSIS — I4819 Other persistent atrial fibrillation: Secondary | ICD-10-CM

## 2017-12-02 DIAGNOSIS — I10 Essential (primary) hypertension: Secondary | ICD-10-CM

## 2017-12-02 NOTE — Progress Notes (Signed)
PCP: Patient, No Pcp Per    Russell Arnold is a 57 y.o. male who presents today for routine electrophysiology followup.  Since his recent afib ablation, the patient reports doing very well.  he denies procedure related complications and is pleased with the results of the procedure.  He did have some ERAF early which has improved. Today, he denies symptoms of palpitations, chest pain, shortness of breath,  lower extremity edema, dizziness, presyncope, or syncope.  The patient is otherwise without complaint today.   Past Medical History:  Diagnosis Date  . A-fib (HCC)   . Acute diastolic heart failure (HCC) 05/2016  . Family history of adverse reaction to anesthesia   . Hypertension    Past Surgical History:  Procedure Laterality Date  . ABLATION OF DYSRHYTHMIC FOCUS  09/04/2016  . APPENDECTOMY    . ATRIAL FIBRILLATION ABLATION N/A 08/22/2017   Procedure: ATRIAL FIBRILLATION ABLATION;  Surgeon: Hillis Range, MD;  Location: [facility] INVASIVE CV LAB;  Service: Cardiovascular;  Laterality: N/A;  . CARDIOVERSION N/A 06/14/2016   Procedure: CARDIOVERSION;  Surgeon: Peter M Swaziland, MD;  Location: [facility] ENDOSCOPY;  Service: Cardiovascular;  Laterality: N/A;  . CARDIOVERSION N/A 06/17/2016   Procedure: CARDIOVERSION;  Surgeon: Dolores Patty, MD;  Location: [facility] OR;  Service: Cardiovascular;  Laterality: N/A;  . ELECTROPHYSIOLOGIC STUDY N/A 09/04/2016   Procedure: Atrial Fibrillation Ablation;  Surgeon: Hillis Range, MD;  Location: [facility] INVASIVE CV LAB;  Service: Cardiovascular;  Laterality: N/A;  . TEE WITHOUT CARDIOVERSION N/A 09/03/2016   Procedure: TRANSESOPHAGEAL ECHOCARDIOGRAM (TEE);  Surgeon: Thurmon Fair, MD;  Location: [facility] ENDOSCOPY;  Service: Cardiovascular;  Laterality: N/A;  . TEE WITHOUT CARDIOVERSION N/A 08/21/2017   Procedure: TRANSESOPHAGEAL ECHOCARDIOGRAM (TEE);  Surgeon: Wendall Stade, MD;  Location: [facility] ENDOSCOPY;  Service: Cardiovascular;  Laterality: N/A;    ROS- all systems  are personally reviewed and negatives except as per HPI above  Current Outpatient Medications  Medication Sig Dispense Refill  . acetaminophen (TYLENOL) 500 MG tablet Take 1,000 mg by mouth every 6 (six) hours as needed (for muscle aches/headaches.).    Marland Kitchen carvedilol (COREG) 25 MG tablet Take 1 tablet (25 mg total) by mouth 2 (two) times daily with a meal. 60 tablet 3  . cholecalciferol (VITAMIN D) 1000 units tablet Take 1,000 Units by mouth daily.    Marland Kitchen ibuprofen (ADVIL,MOTRIN) 200 MG tablet Take 400 mg by mouth every 8 (eight) hours as needed (for headaches/muscle aches.).    Marland Kitchen losartan (COZAAR) 50 MG tablet Take 1 tablet (50 mg total) by mouth daily. 90 tablet 3  . oxymetazoline (AFRIN) 0.05 % nasal spray Place 1 spray into both nostrils 2 (two) times daily as needed for congestion.    . rivaroxaban (XARELTO) 20 MG TABS tablet Take 1 tablet (20 mg total) by mouth daily with supper. 30 tablet 3  . pantoprazole (PROTONIX) 40 MG tablet Take 1 tablet (40 mg total) daily by mouth. (Patient not taking: Reported on 09/20/2017) 45 tablet 0   No current facility-administered medications for this visit.     Physical Exam: Vitals:   12/02/17 1009  BP: 124/78  Pulse: 64  Weight: 285 lb (129.3 kg)  Height: 6\' 2"  (1.88 m)    GEN- The patient is well appearing, alert and oriented x 3 today.   Head- normocephalic, atraumatic Eyes-  Sclera clear, conjunctiva pink Ears- hearing intact Oropharynx- clear Lungs- Clear to ausculation bilaterally, normal work of breathing Heart- Regular rate and rhythm, no murmurs, rubs  or gallops, PMI not laterally displaced GI- soft, NT, ND, + BS Extremities- no clubbing, cyanosis, or edema  EKG tracing ordered today is personally reviewed and shows sinus rhythm  Assessment and Plan:  1. Persistent  atrial fibrillation Doing well s/p ablation chads2vasc score is 1 Continue current medicines for now  2. HTN Stable No change required today  3. Obesity Body  mass index is 36.59 kg/m. Lifestyle modification encouraged  Return to see me in 3 months  Hillis Range MD, [facility] 12/02/2017 10:34 AM

## 2017-12-02 NOTE — Patient Instructions (Addendum)
Medication Instructions:  Your physician recommends that you continue on your current medications as directed. Please refer to the Current Medication list given to you today.  Labwork: None ordered.  Testing/Procedures: None ordered.  Follow-Up: Your physician wants you to follow-up in: 3 months with Dr. Allred.      Any Other Special Instructions Will Be Listed Below (If Applicable).  If you need a refill on your cardiac medications before your next appointment, please call your pharmacy.   

## 2017-12-03 IMAGING — DX DG CHEST 1V PORT
1 series · 1 of 1 positions shown · non-contrast
Comparison: None.

CLINICAL DATA: Tachycardia. Atrial fibrillation. Shortness of
breath.

EXAM:
PORTABLE CHEST 1 VIEW

[chest ap]
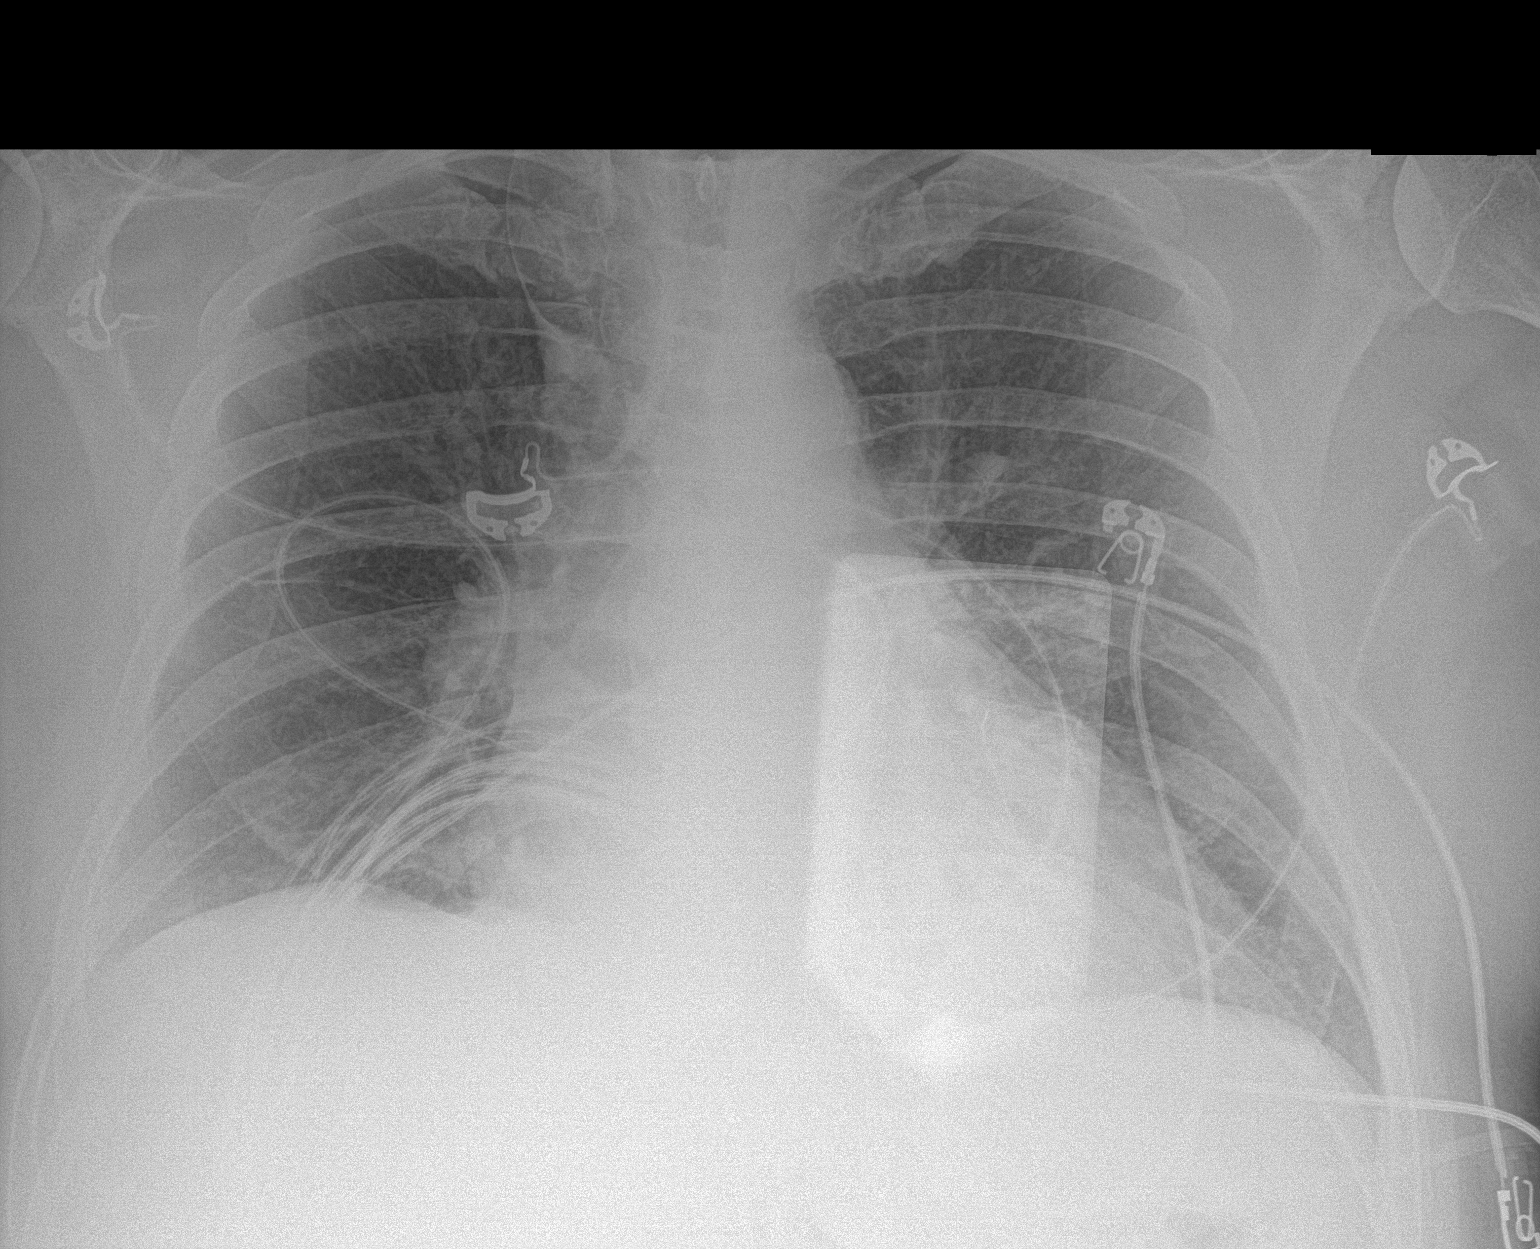

[1 of 1 positions shown; findings below may reference images not displayed]

FINDINGS: The heart size and mediastinal contours are within normal limits.
Both lungs are clear. The visualized skeletal structures are
unremarkable.
IMPRESSION: Normal chest.

## 2017-12-29 ENCOUNTER — Other Ambulatory Visit (HOSPITAL_COMMUNITY): Payer: Self-pay | Admitting: Nurse Practitioner

## 2017-12-31 ENCOUNTER — Other Ambulatory Visit (HOSPITAL_COMMUNITY): Payer: Self-pay | Admitting: *Deleted

## 2017-12-31 MED ORDER — LOSARTAN POTASSIUM 50 MG PO TABS: 50.0000 mg | ORAL_TABLET | Freq: Every day | ORAL | 3 refills | Status: DC

## 2018-01-20 ENCOUNTER — Other Ambulatory Visit: Payer: Self-pay | Admitting: Internal Medicine

## 2018-01-21 NOTE — Telephone Encounter (Signed)
Pt is a 57 yr old male who saw Dr. Johney Frame on 12/02/17. Weight was 129.3Kg. SCr from 08/16/17 was 1.06.Creatine clearance is 156mL/min. Will refill Xarelto 20mg  QD.

## 2018-03-07 ENCOUNTER — Ambulatory Visit: Payer: BLUE CROSS/BLUE SHIELD | Admitting: Internal Medicine

## 2018-03-07 ENCOUNTER — Encounter: Payer: Self-pay | Admitting: Internal Medicine

## 2018-03-07 VITALS — BP 122/72 | HR 152 | Ht 74.0 in | Wt 283.0 lb

## 2018-03-07 DIAGNOSIS — I10 Essential (primary) hypertension: Secondary | ICD-10-CM | POA: Diagnosis not present

## 2018-03-07 DIAGNOSIS — I481 Persistent atrial fibrillation: Secondary | ICD-10-CM | POA: Diagnosis not present

## 2018-03-07 DIAGNOSIS — I4819 Other persistent atrial fibrillation: Secondary | ICD-10-CM

## 2018-03-07 NOTE — Patient Instructions (Addendum)
Medication Instructions:  Your physician recommends that you continue on your current medications as directed. Please refer to the Current Medication list given to you today.  Labwork: None ordered.  Testing/Procedures: None ordered.  Follow-Up: Your physician wants you to follow-up in: 3 months with Dr. Allred.      Any Other Special Instructions Will Be Listed Below (If Applicable).  If you need a refill on your cardiac medications before your next appointment, please call your pharmacy.   

## 2018-03-07 NOTE — Progress Notes (Signed)
PCP: Patient, No Pcp Per   Primary EP: Dr Johney Frame  Russell Arnold is a 57 y.o. male who presents today for routine electrophysiology followup.  Since last being seen in our clinic, the patient reports doing reasonably well.  He continues to have some afib episodes.  He feels that these are mostly controlled.  He did have afib on ekg earlier (currently in sinus) but attributes this to taking sudafed for nasal congestion.  Today, he denies symptoms of chest pain, shortness of breath,  lower extremity edema, dizziness, presyncope, or syncope.  The patient is otherwise without complaint today.   Past Medical History:  Diagnosis Date  . A-fib (HCC)   . Acute diastolic heart failure (HCC) 05/2016  . Family history of adverse reaction to anesthesia   . Hypertension    Past Surgical History:  Procedure Laterality Date  . ABLATION OF DYSRHYTHMIC FOCUS  09/04/2016  . APPENDECTOMY    . ATRIAL FIBRILLATION ABLATION N/A 08/22/2017   Procedure: ATRIAL FIBRILLATION ABLATION;  Surgeon: Hillis Range, MD;  Location: [facility] INVASIVE CV LAB;  Service: Cardiovascular;  Laterality: N/A;  . CARDIOVERSION N/A 06/14/2016   Procedure: CARDIOVERSION;  Surgeon: Peter M Swaziland, MD;  Location: [facility] ENDOSCOPY;  Service: Cardiovascular;  Laterality: N/A;  . CARDIOVERSION N/A 06/17/2016   Procedure: CARDIOVERSION;  Surgeon: Dolores Patty, MD;  Location: [facility] OR;  Service: Cardiovascular;  Laterality: N/A;  . ELECTROPHYSIOLOGIC STUDY N/A 09/04/2016   Procedure: Atrial Fibrillation Ablation;  Surgeon: Hillis Range, MD;  Location: [facility] INVASIVE CV LAB;  Service: Cardiovascular;  Laterality: N/A;  . TEE WITHOUT CARDIOVERSION N/A 09/03/2016   Procedure: TRANSESOPHAGEAL ECHOCARDIOGRAM (TEE);  Surgeon: Thurmon Fair, MD;  Location: [facility] ENDOSCOPY;  Service: Cardiovascular;  Laterality: N/A;  . TEE WITHOUT CARDIOVERSION N/A 08/21/2017   Procedure: TRANSESOPHAGEAL ECHOCARDIOGRAM (TEE);  Surgeon: Wendall Stade, MD;  Location:  [facility] ENDOSCOPY;  Service: Cardiovascular;  Laterality: N/A;    ROS- all systems are reviewed and negatives except as per HPI above  Current Outpatient Medications  Medication Sig Dispense Refill  . acetaminophen (TYLENOL) 500 MG tablet Take 1,000 mg by mouth every 6 (six) hours as needed (for muscle aches/headaches.).    Marland Kitchen carvedilol (COREG) 25 MG tablet TAKE 1 TABLET BY MOUTH TWICE DAILY WITH A MEAL 60 tablet 3  . cholecalciferol (VITAMIN D) 1000 units tablet Take 1,000 Units by mouth daily.    Marland Kitchen ibuprofen (ADVIL,MOTRIN) 200 MG tablet Take 400 mg by mouth every 8 (eight) hours as needed (for headaches/muscle aches.).    Marland Kitchen losartan (COZAAR) 50 MG tablet Take 1 tablet (50 mg total) by mouth daily. 90 tablet 3  . oxymetazoline (AFRIN) 0.05 % nasal spray Place 1 spray into both nostrils 2 (two) times daily as needed for congestion.    . rivaroxaban (XARELTO) 20 MG TABS tablet Take 1 tablet (20 mg total) by mouth daily with supper. 30 tablet 3   No current facility-administered medications for this visit.     Physical Exam: Vitals:   03/07/18 1019  BP: 122/72  Pulse: (!) 152  Weight: 283 lb (128.4 kg)  Height: 6\' 2"  (1.88 m)    GEN- The patient is well appearing, alert and oriented x 3 today.   Head- normocephalic, atraumatic Eyes-  Sclera clear, conjunctiva pink Ears- hearing intact Oropharynx- clear Lungs- Clear to ausculation bilaterally, normal work of breathing Heart- Regular rate and rhythm, no murmurs, rubs or gallops, PMI not laterally displaced GI- soft, NT, ND, + BS Extremities-  no clubbing, cyanosis, or edema  Wt Readings from Last 3 Encounters:  03/07/18 283 lb (128.4 kg)  12/02/17 285 lb (129.3 kg)  09/20/17 288 lb (130.6 kg)    EKG tracing ordered today is personally reviewed and shows sinus rhythm with degeneration into afib with RVR  Assessment and Plan:  1. Persistent afib Doing reasonably well post ablation but with some afib still chads2vasc score is 1.   He wishes to continue xarelto He feels that his afib is much improved and does not wish to make change.  He states taht he will stop taking sudafed/ decongestants.  2. HTN Stable No change required today  3. Obesity Body mass index is 36.34 kg/m. Wt Readings from Last 3 Encounters:  03/07/18 283 lb (128.4 kg)  12/02/17 285 lb (129.3 kg)  09/20/17 288 lb (130.6 kg)   Lifestyle modification is encouraged Return in 3 months  Hillis Range MD, [facility] 03/07/2018 11:00 AM

## 2018-05-08 ENCOUNTER — Other Ambulatory Visit (HOSPITAL_COMMUNITY): Payer: Self-pay | Admitting: Nurse Practitioner

## 2018-06-13 ENCOUNTER — Encounter: Payer: Self-pay | Admitting: Internal Medicine

## 2018-06-13 ENCOUNTER — Ambulatory Visit: Payer: BLUE CROSS/BLUE SHIELD | Admitting: Internal Medicine

## 2018-06-13 VITALS — BP 144/96 | HR 67 | Ht 74.0 in | Wt 288.2 lb

## 2018-06-13 DIAGNOSIS — I428 Other cardiomyopathies: Secondary | ICD-10-CM | POA: Diagnosis not present

## 2018-06-13 DIAGNOSIS — I4819 Other persistent atrial fibrillation: Secondary | ICD-10-CM

## 2018-06-13 DIAGNOSIS — Z8679 Personal history of other diseases of the circulatory system: Secondary | ICD-10-CM

## 2018-06-13 DIAGNOSIS — I4892 Unspecified atrial flutter: Secondary | ICD-10-CM | POA: Diagnosis not present

## 2018-06-13 DIAGNOSIS — I481 Persistent atrial fibrillation: Secondary | ICD-10-CM

## 2018-06-13 DIAGNOSIS — Z9889 Other specified postprocedural states: Secondary | ICD-10-CM

## 2018-06-13 MED ORDER — SILDENAFIL CITRATE 100 MG PO TABS: 100.0000 mg | ORAL_TABLET | Freq: Every day | ORAL | 1 refills | Status: DC | PRN

## 2018-06-13 MED ORDER — CARVEDILOL 25 MG PO TABS: 12.5000 mg | ORAL_TABLET | Freq: Two times a day (BID) | ORAL | 3 refills | Status: DC

## 2018-06-13 NOTE — Patient Instructions (Addendum)
Medication Instructions:  Your physician has recommended you make the following change in your medication:   1. Reduce your Carvedilol to 12.5mg , two times per day. 2. Begin Viagra, 100mg  as directed.  Labwork: None ordered.  Testing/Procedures: None ordered.  Follow-Up: Your physician recommends that you schedule a follow-up appointment in:   3 months with Rudi Coco  6 months with Dr Johney Frame  Any Other Special Instructions Will Be Listed Below (If Applicable).     If you need a refill on your cardiac medications before your next appointment, please call your pharmacy.

## 2018-06-13 NOTE — Progress Notes (Signed)
PCP: Patient, No Pcp Per   Primary EP: Dr Johney Frame  Russell Arnold is a 57 y.o. male who presents today for routine electrophysiology followup.  Since last being seen in our clinic, the patient reports doing very well.  Today, he denies symptoms of palpitations, chest pain, shortness of breath,  lower extremity edema, dizziness, presyncope, or syncope.  Afib is well controlled.  He has noticed some erectile dysfunction, though only mild.   The patient is otherwise without complaint today.   Past Medical History:  Diagnosis Date  . A-fib (HCC)   . Acute diastolic heart failure (HCC) 05/2016  . Family history of adverse reaction to anesthesia   . Hypertension    Past Surgical History:  Procedure Laterality Date  . ABLATION OF DYSRHYTHMIC FOCUS  09/04/2016  . APPENDECTOMY    . ATRIAL FIBRILLATION ABLATION N/A 08/22/2017   Procedure: ATRIAL FIBRILLATION ABLATION;  Surgeon: Hillis Range, MD;  Location: [facility] INVASIVE CV LAB;  Service: Cardiovascular;  Laterality: N/A;  . CARDIOVERSION N/A 06/14/2016   Procedure: CARDIOVERSION;  Surgeon: Peter M Swaziland, MD;  Location: [facility] ENDOSCOPY;  Service: Cardiovascular;  Laterality: N/A;  . CARDIOVERSION N/A 06/17/2016   Procedure: CARDIOVERSION;  Surgeon: Dolores Patty, MD;  Location: [facility] OR;  Service: Cardiovascular;  Laterality: N/A;  . ELECTROPHYSIOLOGIC STUDY N/A 09/04/2016   Procedure: Atrial Fibrillation Ablation;  Surgeon: Hillis Range, MD;  Location: [facility] INVASIVE CV LAB;  Service: Cardiovascular;  Laterality: N/A;  . TEE WITHOUT CARDIOVERSION N/A 09/03/2016   Procedure: TRANSESOPHAGEAL ECHOCARDIOGRAM (TEE);  Surgeon: Thurmon Fair, MD;  Location: [facility] ENDOSCOPY;  Service: Cardiovascular;  Laterality: N/A;  . TEE WITHOUT CARDIOVERSION N/A 08/21/2017   Procedure: TRANSESOPHAGEAL ECHOCARDIOGRAM (TEE);  Surgeon: Wendall Stade, MD;  Location: [facility] ENDOSCOPY;  Service: Cardiovascular;  Laterality: N/A;    ROS- all systems are reviewed and  negatives except as per HPI above  Current Outpatient Medications  Medication Sig Dispense Refill  . acetaminophen (TYLENOL) 500 MG tablet Take 1,000 mg by mouth every 6 (six) hours as needed (for muscle aches/headaches.).    Marland Kitchen carvedilol (COREG) 25 MG tablet TAKE 1 TABLET BY MOUTH TWICE DAILY WITH A MEAL 60 tablet 3  . cholecalciferol (VITAMIN D) 1000 units tablet Take 1,000 Units by mouth daily.    Marland Kitchen ibuprofen (ADVIL,MOTRIN) 200 MG tablet Take 400 mg by mouth every 8 (eight) hours as needed (for headaches/muscle aches.).    Marland Kitchen losartan (COZAAR) 50 MG tablet Take 1 tablet (50 mg total) by mouth daily. 90 tablet 3  . oxymetazoline (AFRIN) 0.05 % nasal spray Place 1 spray into both nostrils 2 (two) times daily as needed for congestion.    . rivaroxaban (XARELTO) 20 MG TABS tablet Take 1 tablet (20 mg total) by mouth daily with supper. 30 tablet 3   No current facility-administered medications for this visit.     Physical Exam: Vitals:   06/13/18 0911  BP: (!) 144/96  Pulse: 67  SpO2: 97%  Weight: 288 lb 3.2 oz (130.7 kg)  Height: 6\' 2"  (1.88 m)    GEN- The patient is well appearing, alert and oriented x 3 today.   Head- normocephalic, atraumatic Eyes-  Sclera clear, conjunctiva pink Ears- hearing intact Oropharynx- clear Lungs- Clear to ausculation bilaterally, normal work of breathing Heart- Regular rate and rhythm, no murmurs, rubs or gallops, PMI not laterally displaced GI- soft, NT, ND, + BS Extremities- no clubbing, cyanosis, or edema  Wt Readings from Last 3 Encounters:  06/13/18  288 lb 3.2 oz (130.7 kg)  03/07/18 283 lb (128.4 kg)  12/02/17 285 lb (129.3 kg)    EKG tracing ordered today is personally reviewed and shows sinus rhythm 67 bpm, otherwise normal ekg  Assessment and Plan:  1. Persistent afib Doing well post ablation off AAD therapy chads2vasc score is 1.  He is on xarelto  2. HTN Stable No change required today  3. Obesity Body mass index is 37  kg/m. Lifestyle modification again encouraged today  4. Erectile dysfunction New Reduce coreg to 12.5mg  BID viagra 100mg , as directed #10, r 1 If not improved, follow-up with PCP for additional testing  Return to see Lupita Leash in AF clinic in 3 months I will see in 6 months  Hillis Range MD, [facility] 06/13/2018 9:46 AM

## 2018-09-15 ENCOUNTER — Ambulatory Visit (HOSPITAL_COMMUNITY)
Admission: RE | Admit: 2018-09-15 | Discharge: 2018-09-15 | Disposition: A | Discharge status: Home / Self Care | Payer: BLUE CROSS/BLUE SHIELD | Source: Ambulatory Visit | Attending: Nurse Practitioner | Admitting: Nurse Practitioner

## 2018-09-15 ENCOUNTER — Encounter (HOSPITAL_COMMUNITY): Payer: Self-pay | Admitting: Nurse Practitioner

## 2018-09-15 VITALS — BP 146/92 | HR 133 | Ht 74.0 in | Wt 300.0 lb

## 2018-09-15 DIAGNOSIS — I110 Hypertensive heart disease with heart failure: Secondary | ICD-10-CM | POA: Diagnosis not present

## 2018-09-15 DIAGNOSIS — Z79899 Other long term (current) drug therapy: Secondary | ICD-10-CM | POA: Insufficient documentation

## 2018-09-15 DIAGNOSIS — Z7901 Long term (current) use of anticoagulants: Secondary | ICD-10-CM | POA: Insufficient documentation

## 2018-09-15 DIAGNOSIS — Z9889 Other specified postprocedural states: Secondary | ICD-10-CM | POA: Insufficient documentation

## 2018-09-15 DIAGNOSIS — I5032 Chronic diastolic (congestive) heart failure: Secondary | ICD-10-CM | POA: Diagnosis not present

## 2018-09-15 DIAGNOSIS — I4891 Unspecified atrial fibrillation: Secondary | ICD-10-CM | POA: Diagnosis not present

## 2018-09-15 MED ORDER — CARVEDILOL 6.25 MG PO TABS: 6.2500 mg | ORAL_TABLET | Freq: Two times a day (BID) | ORAL | 3 refills | Status: DC

## 2018-09-15 NOTE — Progress Notes (Signed)
Primary Care Physician: Patient, No Pcp Per Referring Physician: Dr. Joesphine Bare Jakai Risse is a 57 y.o. male with a h/o afib s/p ablation x 2, 08/2016/08/2017. When he was seen by Dr. Johney Frame in August, he was doing well in SR and Coreg was reduced to 12.5 mg bid for dizziness. Pt states only taking once a day in the evening.  Pt is now in the afib clinic for scheduled f/u. He has not felt well x 2 weeks with URI symptoms.  Took decongestants several days ago. He is in afib with RVR this am but pt does not think that he has been persistent, but has noted some elevated HR's over the last 2 weeks.   Today, he denies symptoms of palpitations, chest pain, shortness of breath, orthopnea, PND, lower extremity edema, dizziness, presyncope, syncope, or neurologic sequela. The patient is tolerating medications without difficulties and is otherwise without complaint today.   Past Medical History:  Diagnosis Date  . A-fib (HCC)   . Acute diastolic heart failure (HCC) 05/2016  . Family history of adverse reaction to anesthesia   . Hypertension    Past Surgical History:  Procedure Laterality Date  . ABLATION OF DYSRHYTHMIC FOCUS  09/04/2016  . APPENDECTOMY    . ATRIAL FIBRILLATION ABLATION N/A 08/22/2017   Procedure: ATRIAL FIBRILLATION ABLATION;  Surgeon: Hillis Range, MD;  Location: [facility] INVASIVE CV LAB;  Service: Cardiovascular;  Laterality: N/A;  . CARDIOVERSION N/A 06/14/2016   Procedure: CARDIOVERSION;  Surgeon: Peter M Swaziland, MD;  Location: [facility] ENDOSCOPY;  Service: Cardiovascular;  Laterality: N/A;  . CARDIOVERSION N/A 06/17/2016   Procedure: CARDIOVERSION;  Surgeon: Dolores Patty, MD;  Location: [facility] OR;  Service: Cardiovascular;  Laterality: N/A;  . ELECTROPHYSIOLOGIC STUDY N/A 09/04/2016   Procedure: Atrial Fibrillation Ablation;  Surgeon: Hillis Range, MD;  Location: [facility] INVASIVE CV LAB;  Service: Cardiovascular;  Laterality: N/A;  . TEE WITHOUT CARDIOVERSION N/A 09/03/2016   Procedure: TRANSESOPHAGEAL ECHOCARDIOGRAM (TEE);  Surgeon: Thurmon Fair, MD;  Location: [facility] ENDOSCOPY;  Service: Cardiovascular;  Laterality: N/A;  . TEE WITHOUT CARDIOVERSION N/A 08/21/2017   Procedure: TRANSESOPHAGEAL ECHOCARDIOGRAM (TEE);  Surgeon: Wendall Stade, MD;  Location: [facility] ENDOSCOPY;  Service: Cardiovascular;  Laterality: N/A;    Current Outpatient Medications  Medication Sig Dispense Refill  . acetaminophen (TYLENOL) 500 MG tablet Take 1,000 mg by mouth every 6 (six) hours as needed (for muscle aches/headaches.).    Marland Kitchen carvedilol (COREG) 6.25 MG tablet Take 1 tablet (6.25 mg total) by mouth 2 (two) times daily. 60 tablet 3  . cholecalciferol (VITAMIN D) 1000 units tablet Take 1,000 Units by mouth daily.    Marland Kitchen losartan (COZAAR) 50 MG tablet Take 1 tablet (50 mg total) by mouth daily. 90 tablet 3  . rivaroxaban (XARELTO) 20 MG TABS tablet Take 1 tablet (20 mg total) by mouth daily with supper. 30 tablet 3  . sildenafil (VIAGRA) 100 MG tablet Take 1 tablet (100 mg total) by mouth daily as needed for erectile dysfunction. 10 tablet 1   No current facility-administered medications for this encounter.     Allergies  Allergen Reactions  . Amoxicillin Hives    Hives - noted "red splotches" - also ineffective per pt Has patient had a PCN reaction causing immediate rash, facial/tongue/throat swelling, SOB or lightheadedness with hypotension: No Has patient had a PCN reaction causing severe rash involving mucus membranes or skin necrosis: No Has patient had a PCN reaction that required hospitalization:No Has patient had a  PCN reaction occurring within the last 10 years:Yes If all of the above answers are "NO", then may proceed with Cephalosporin use.      Social History   Socioeconomic History  . Marital status: Married    Spouse name: Not on file  . Number of children: Not on file  . Years of education: Not on file  . Highest education level: Not on file  Occupational History    . Occupation: Engineer, technical sales Needs  . Financial resource strain: Not on file  . Food insecurity:    Worry: Not on file    Inability: Not on file  . Transportation needs:    Medical: Not on file    Non-medical: Not on file  Tobacco Use  . Smoking status: Never Smoker  . Smokeless tobacco: Former Neurosurgeon    Types: Chew  Substance and Sexual Activity  . Alcohol use: No  . Drug use: No  . Sexual activity: Not on file  Lifestyle  . Physical activity:    Days per week: Not on file    Minutes per session: Not on file  . Stress: Not on file  Relationships  . Social connections:    Talks on phone: Not on file    Gets together: Not on file    Attends religious service: Not on file    Active member of club or organization: Not on file    Attends meetings of clubs or organizations: Not on file    Relationship status: Not on file  . Intimate partner violence:    Fear of current or ex partner: Not on file    Emotionally abused: Not on file    Physically abused: Not on file    Forced sexual activity: Not on file  Other Topics Concern  . Not on file  Social History Narrative  . Not on file    Family History  Problem Relation Age of Onset  . Heart failure Mother     ROS- All systems are reviewed and negative except as per the HPI above  Physical Exam: Vitals:   09/15/18 0839  BP: (!) 146/92  Pulse: (!) 133  Weight: 136.1 kg  Height: 6\' 2"  (1.88 m)   Wt Readings from Last 3 Encounters:  09/15/18 136.1 kg  06/13/18 130.7 kg  03/07/18 128.4 kg    Labs: Lab Results  Component Value Date   NA 137 08/16/2017   K 4.1 08/16/2017   CL 104 08/16/2017   CO2 24 08/16/2017   GLUCOSE 106 (H) 08/16/2017   BUN 13 08/16/2017   CREATININE 1.06 08/16/2017   CALCIUM 9.4 08/16/2017   MG 1.9 08/16/2017   No results found for: INR Lab Results  Component Value Date   CHOL 124 06/14/2016   HDL 54 06/14/2016   LDLCALC 60 06/14/2016   TRIG 48 06/14/2016     GEN- The  patient is well appearing, alert and oriented x 3 today.   Head- normocephalic, atraumatic Eyes-  Sclera clear, conjunctiva pink Ears- hearing intact Oropharynx- clear Neck- supple, no JVP Lymph- no cervical lymphadenopathy Lungs- Clear to ausculation bilaterally, normal work of breathing Heart- rapid regular rate and rhythm, no murmurs, rubs or gallops, PMI not laterally displaced GI- soft, NT, ND, + BS Extremities- no clubbing, cyanosis, or edema MS- no significant deformity or atrophy Skin- no rash or lesion Psych- euthymic mood, full affect Neuro- strength and sensation are intact  EKG-afib with rvr at 133 bpm  Assessment and  Plan: 1. Afib In afib this am, not sure if paroxysmal or persistent as he has not felt well x 2 weeks Will rx coreg at 6.25 mg bid as he says 12.5 mg bid made him dizzy as he is only taking 12.5 mg once a day now Return to clinic Thursday for EKG Avoid decongestants Continue xarelto at 20 mg daily  He has had weight gain, but pt does not seem to think it is fluid, will follow  Lupita Leash [facility]. [name] [address] [place], [place] [address] [phone]

## 2018-09-15 NOTE — Patient Instructions (Signed)
Coreg to 6.25mg twice a day 

## 2018-09-18 ENCOUNTER — Ambulatory Visit (HOSPITAL_COMMUNITY)
Admission: RE | Admit: 2018-09-18 | Discharge: 2018-09-18 | Disposition: A | Discharge status: Home / Self Care | Payer: BLUE CROSS/BLUE SHIELD | Source: Ambulatory Visit | Attending: Nurse Practitioner | Admitting: Nurse Practitioner

## 2018-09-18 ENCOUNTER — Other Ambulatory Visit: Payer: Self-pay

## 2018-09-18 ENCOUNTER — Observation Stay (HOSPITAL_COMMUNITY)
Admission: EM | Admit: 2018-09-18 | Discharge: 2018-09-19 | Disposition: A | Discharge status: Home / Self Care | Payer: BLUE CROSS/BLUE SHIELD | Attending: Internal Medicine | Admitting: Internal Medicine

## 2018-09-18 ENCOUNTER — Emergency Department (HOSPITAL_COMMUNITY): Payer: BLUE CROSS/BLUE SHIELD

## 2018-09-18 ENCOUNTER — Encounter (HOSPITAL_COMMUNITY): Payer: Self-pay | Admitting: Internal Medicine

## 2018-09-18 ENCOUNTER — Other Ambulatory Visit (HOSPITAL_COMMUNITY): Payer: Self-pay

## 2018-09-18 DIAGNOSIS — R0981 Nasal congestion: Secondary | ICD-10-CM | POA: Insufficient documentation

## 2018-09-18 DIAGNOSIS — I5032 Chronic diastolic (congestive) heart failure: Secondary | ICD-10-CM | POA: Insufficient documentation

## 2018-09-18 DIAGNOSIS — I480 Paroxysmal atrial fibrillation: Principal | ICD-10-CM | POA: Insufficient documentation

## 2018-09-18 DIAGNOSIS — Z880 Allergy status to penicillin: Secondary | ICD-10-CM | POA: Diagnosis not present

## 2018-09-18 DIAGNOSIS — I4892 Unspecified atrial flutter: Secondary | ICD-10-CM

## 2018-09-18 DIAGNOSIS — R000 Tachycardia, unspecified: Secondary | ICD-10-CM | POA: Diagnosis not present

## 2018-09-18 DIAGNOSIS — R197 Diarrhea, unspecified: Secondary | ICD-10-CM | POA: Diagnosis not present

## 2018-09-18 DIAGNOSIS — I4891 Unspecified atrial fibrillation: Secondary | ICD-10-CM

## 2018-09-18 DIAGNOSIS — Z7901 Long term (current) use of anticoagulants: Secondary | ICD-10-CM | POA: Insufficient documentation

## 2018-09-18 DIAGNOSIS — Z79899 Other long term (current) drug therapy: Secondary | ICD-10-CM | POA: Diagnosis not present

## 2018-09-18 DIAGNOSIS — R42 Dizziness and giddiness: Secondary | ICD-10-CM | POA: Insufficient documentation

## 2018-09-18 DIAGNOSIS — N179 Acute kidney failure, unspecified: Secondary | ICD-10-CM | POA: Insufficient documentation

## 2018-09-18 DIAGNOSIS — I110 Hypertensive heart disease with heart failure: Secondary | ICD-10-CM | POA: Diagnosis not present

## 2018-09-18 DIAGNOSIS — I10 Essential (primary) hypertension: Secondary | ICD-10-CM | POA: Diagnosis not present

## 2018-09-18 DIAGNOSIS — R001 Bradycardia, unspecified: Secondary | ICD-10-CM | POA: Insufficient documentation

## 2018-09-18 LAB — CBC
HEMATOCRIT: 53.7 % — AB (ref 39.0–52.0)
Hemoglobin: 17.8 g/dL — ABNORMAL HIGH (ref 13.0–17.0)
MCH: 30.8 pg (ref 26.0–34.0)
MCHC: 33.1 g/dL (ref 30.0–36.0)
MCV: 93.1 fL (ref 80.0–100.0)
PLATELETS: 247 10*3/uL (ref 150–400)
RBC: 5.77 MIL/uL (ref 4.22–5.81)
RDW: 12.3 % (ref 11.5–15.5)
WBC: 7 10*3/uL (ref 4.0–10.5)
nRBC: 0 % (ref 0.0–0.2)

## 2018-09-18 LAB — MAGNESIUM: Magnesium: 2.3 mg/dL (ref 1.7–2.4)

## 2018-09-18 LAB — BASIC METABOLIC PANEL
Anion gap: 10 (ref 5–15)
BUN: 20 mg/dL (ref 6–20)
CALCIUM: 9.5 mg/dL (ref 8.9–10.3)
CHLORIDE: 104 mmol/L (ref 98–111)
CO2: 27 mmol/L (ref 22–32)
Creatinine, Ser: 1.3 mg/dL — ABNORMAL HIGH (ref 0.61–1.24)
GFR calc Af Amer: >60 mL/min (ref >60)
GFR calc non Af Amer: >60 mL/min (ref >60)
Glucose, Bld: 131 mg/dL — ABNORMAL HIGH (ref 70–99)
Potassium: 4.8 mmol/L (ref 3.5–5.1)
SODIUM: 141 mmol/L (ref 135–145)

## 2018-09-18 LAB — I-STAT TROPONIN, ED: Troponin i, poc: 0.01 ng/mL (ref 0.00–0.08)

## 2018-09-18 MED ORDER — ACETAMINOPHEN 325 MG PO TABS: 650.0000 mg | ORAL_TABLET | Freq: Four times a day (QID) | ORAL | Status: DC | PRN

## 2018-09-18 MED ORDER — SODIUM CHLORIDE 0.9 % IV SOLN: 250.0000 mL | INTRAVENOUS | Status: DC | PRN

## 2018-09-18 MED ORDER — ONDANSETRON HCL 4 MG/2ML IJ SOLN: 4.0000 mg | Freq: Four times a day (QID) | INTRAMUSCULAR | Status: DC | PRN

## 2018-09-18 MED ORDER — POLYETHYLENE GLYCOL 3350 17 G PO PACK: 17.0000 g | PACK | Freq: Every day | ORAL | Status: DC | PRN

## 2018-09-18 MED ORDER — RIVAROXABAN 20 MG PO TABS
20.0000 mg | ORAL_TABLET | Freq: Every day | ORAL | Status: DC
Start: 2018-09-18 — End: 2018-09-19
  Administered 2018-09-18: 20 mg via ORAL
  Filled 2018-09-18: qty 1

## 2018-09-18 MED ORDER — ONDANSETRON HCL 4 MG PO TABS: 4.0000 mg | ORAL_TABLET | Freq: Four times a day (QID) | ORAL | Status: DC | PRN

## 2018-09-18 MED ORDER — CARVEDILOL 6.25 MG PO TABS
6.2500 mg | ORAL_TABLET | Freq: Two times a day (BID) | ORAL | Status: DC
  Administered 2018-09-18 – 2018-09-19 (×2): 6.25 mg via ORAL
  Filled 2018-09-18 (×2): qty 1

## 2018-09-18 MED ORDER — FLECAINIDE ACETATE 100 MG PO TABS
300.0000 mg | ORAL_TABLET | Freq: Once | ORAL | Status: AC
  Administered 2018-09-18: 300 mg via ORAL
  Filled 2018-09-18: qty 3

## 2018-09-18 MED ORDER — ALBUTEROL SULFATE (2.5 MG/3ML) 0.083% IN NEBU: 2.5000 mg | INHALATION_SOLUTION | RESPIRATORY_TRACT | Status: DC | PRN

## 2018-09-18 MED ORDER — PROPOFOL 10 MG/ML IV BOLUS
INTRAVENOUS | Status: AC | PRN
  Administered 2018-09-18: 100 mg via INTRAVENOUS

## 2018-09-18 MED ORDER — SODIUM CHLORIDE 0.9 % IV SOLN
INTRAVENOUS | Status: DC
  Administered 2018-09-18 – 2018-09-19 (×2): via INTRAVENOUS

## 2018-09-18 MED ORDER — SODIUM CHLORIDE 0.9 % IV BOLUS
1000.0000 mL | Freq: Once | INTRAVENOUS | Status: AC
  Administered 2018-09-18: 1000 mL via INTRAVENOUS

## 2018-09-18 MED ORDER — SODIUM CHLORIDE 0.9% FLUSH: 3.0000 mL | Freq: Two times a day (BID) | INTRAVENOUS | Status: DC

## 2018-09-18 MED ORDER — PROPOFOL 10 MG/ML IV BOLUS
1.0000 mg/kg | Freq: Once | INTRAVENOUS | Status: AC
  Administered 2018-09-18: 127 mg via INTRAVENOUS
  Filled 2018-09-18: qty 20

## 2018-09-18 MED ORDER — ACETAMINOPHEN 650 MG RE SUPP: 650.0000 mg | Freq: Four times a day (QID) | RECTAL | Status: DC | PRN

## 2018-09-18 MED ORDER — SODIUM CHLORIDE 0.9% FLUSH: 3.0000 mL | INTRAVENOUS | Status: DC | PRN

## 2018-09-18 MED ORDER — DILTIAZEM LOAD VIA INFUSION
20.0000 mg | Freq: Once | INTRAVENOUS | Status: AC
  Administered 2018-09-18: 20 mg via INTRAVENOUS
  Filled 2018-09-18: qty 20

## 2018-09-18 MED ORDER — METOPROLOL TARTRATE 5 MG/5ML IV SOLN
2.5000 mg | Freq: Once | INTRAVENOUS | Status: AC
  Administered 2018-09-18: 2.5 mg via INTRAVENOUS
  Filled 2018-09-18: qty 5

## 2018-09-18 MED ORDER — DILTIAZEM HCL-DEXTROSE 100-5 MG/100ML-% IV SOLN (PREMIX)
5.0000 mg/h | INTRAVENOUS | Status: DC
  Administered 2018-09-18: 5 mg/h via INTRAVENOUS
  Administered 2018-09-18: 15 mg/h via INTRAVENOUS
  Administered 2018-09-18: 5 mg/h via INTRAVENOUS
  Filled 2018-09-18 (×2): qty 100

## 2018-09-18 MED ORDER — VITAMIN D 25 MCG (1000 UNIT) PO TABS
1000.0000 [IU] | ORAL_TABLET | Freq: Every day | ORAL | Status: DC
  Administered 2018-09-19: 1000 [IU] via ORAL
  Filled 2018-09-18: qty 1

## 2018-09-18 MED ORDER — CARVEDILOL 6.25 MG PO TABS: 6.2500 mg | ORAL_TABLET | Freq: Two times a day (BID) | ORAL | Status: DC

## 2018-09-18 NOTE — H&P (Signed)
HISTORY AND PHYSICAL       PATIENT DETAILS Name: Russell Arnold Age: 57 y.o. Sex: male Date of Birth: 1961/03/06 Admit Date: 09/18/2018 [id number], No Pcp Per   Patient coming from: Home   CHIEF COMPLAINT:  Sent from A. fib clinic for A. fib with RVR  HPI: Russell Arnold is a 57 y.o. male with medical history significant of PAF, hypertension, chronic diastolic heart failure who presented to the hospital as a referral from the A. fib clinic for management of A. fib with RVR.  Per patient, last week he had a common head cold-following which on Tuesday he started having diarrhea that resolved spontaneously.  He had seen nurse practitioner at A. fib clinic where he was found to have atrial fibrillation-was told to take Coreg twice a day (was taking once a day).  He was again followed up at the A. fib clinic this morning where he was found to be in RVR and probably referred to the emergency room.  Patient denies any chest pain, shortness of breath or palpitations.  He no longer has any nausea, vomiting or diarrhea. Patient does acknowledge dizzy spells intermittently for the past few days.  ED Course:  Found to be in A. fib with RVR-since he was on chronic anticoagulation-he was cardioverted x3 but remained in A. fib.  He was subsequently started on a Cardizem infusion and referred to the hospitalist service for admission.  ED MD has already consulted cardiology.  Note: Lives at: Home Mobility: Independent Chronic Indwelling Foley:no   REVIEW OF SYSTEMS:  Constitutional:   No  weight loss, night sweats,  Fevers, chills, fatigue.  HEENT:    No headaches, Dysphagia,Tooth/dental problems,Sore throat  Cardio-vascular: No chest pain,Orthopnea, PND,lower extremity edema, anasarca, palpitations  GI:  No heartburn, indigestion, abdominal pain, nausea, vomiting, diarrhea, melena or hematochezia  Resp: No shortness of breath, cough, hemoptysis,plueritic  chest pain.   Skin:  No rash or lesions.  GU:  No dysuria, change in color of urine, no urgency or frequency.  No flank pain.  Musculoskeletal: No joint pain or swelling.  No decreased range of motion.  No back pain.  Endocrine: No heat intolerance, no cold intolerance, no polyuria, no polydipsia  Psych: No change in mood or affect. No depression or anxiety.  No memory loss.   ALLERGIES:   Allergies  Allergen Reactions  . Amoxicillin Hives    Hives - noted "red splotches" - also ineffective per pt Has patient had a PCN reaction causing immediate rash, facial/tongue/throat swelling, SOB or lightheadedness with hypotension: No Has patient had a PCN reaction causing severe rash involving mucus membranes or skin necrosis: No Has patient had a PCN reaction that required hospitalization:No Has patient had a PCN reaction occurring within the last 10 years:Yes If all of the above answers are "NO", then may proceed with Cephalosporin use.      PAST MEDICAL HISTORY: Past Medical History:  Diagnosis Date  . A-fib (HCC)   . Acute diastolic heart failure (HCC) 05/2016  . Family history of adverse reaction to anesthesia   . Hypertension     PAST SURGICAL HISTORY: Past Surgical History:  Procedure Laterality Date  . ABLATION OF DYSRHYTHMIC FOCUS  09/04/2016  . APPENDECTOMY    . ATRIAL FIBRILLATION ABLATION N/A 08/22/2017   Procedure: ATRIAL FIBRILLATION ABLATION;  Surgeon: Hillis Range, MD;  Location: [facility] INVASIVE CV LAB;  Service: Cardiovascular;  Laterality: N/A;  . CARDIOVERSION N/A 06/14/2016  Procedure: CARDIOVERSION;  Surgeon: Peter M Swaziland, MD;  Location: [facility] ENDOSCOPY;  Service: Cardiovascular;  Laterality: N/A;  . CARDIOVERSION N/A 06/17/2016   Procedure: CARDIOVERSION;  Surgeon: Dolores Patty, MD;  Location: [facility] OR;  Service: Cardiovascular;  Laterality: N/A;  . ELECTROPHYSIOLOGIC STUDY N/A 09/04/2016   Procedure: Atrial Fibrillation Ablation;  Surgeon: Hillis Range,  MD;  Location: [facility] INVASIVE CV LAB;  Service: Cardiovascular;  Laterality: N/A;  . TEE WITHOUT CARDIOVERSION N/A 09/03/2016   Procedure: TRANSESOPHAGEAL ECHOCARDIOGRAM (TEE);  Surgeon: Thurmon Fair, MD;  Location: [facility] ENDOSCOPY;  Service: Cardiovascular;  Laterality: N/A;  . TEE WITHOUT CARDIOVERSION N/A 08/21/2017   Procedure: TRANSESOPHAGEAL ECHOCARDIOGRAM (TEE);  Surgeon: Wendall Stade, MD;  Location: [facility] ENDOSCOPY;  Service: Cardiovascular;  Laterality: N/A;    MEDICATIONS AT HOME: Prior to Admission medications   Medication Sig Start Date End Date Taking? Authorizing Provider  carvedilol (COREG) 6.25 MG tablet Take 1 tablet (6.25 mg total) by mouth 2 (two) times daily. 09/15/18  Yes Newman Nip, NP  cholecalciferol (VITAMIN D) 1000 units tablet Take 1,000 Units by mouth daily.   Yes [provider]  losartan (COZAAR) 50 MG tablet Take 1 tablet (50 mg total) by mouth daily. 12/31/17  Yes Newman Nip, NP  rivaroxaban (XARELTO) 20 MG TABS tablet Take 1 tablet (20 mg total) by mouth daily with supper. 06/24/17  Yes Newman Nip, NP  sildenafil (VIAGRA) 100 MG tablet Take 1 tablet (100 mg total) by mouth daily as needed for erectile dysfunction. 06/13/18  Yes AllredFayrene Fearing, MD    FAMILY HISTORY: Family History  Problem Relation Age of Onset  . Heart failure Mother    SOCIAL HISTORY:  reports that he has never smoked. He has quit using smokeless tobacco.  His smokeless tobacco use included chew. He reports that he does not drink alcohol or use drugs.  PHYSICAL EXAM: Blood pressure 119/84, pulse (!) 41, temperature 97.9 F (36.6 C), temperature source Oral, resp. rate 12, height 6\' 2"  (1.88 m), weight 127 kg, SpO2 99 %.  General appearance :Awake, alert, not in any distress. Speech Clear. Not toxic Looking Eyes:, pupils equally reactive to light and accomodation,no scleral icterus.Pink conjunctiva HEENT: Atraumatic and Normocephalic Neck: supple, no JVD. No cervical  lymphadenopathy. No thyromegaly Resp:Good air entry bilaterally, no added sounds  CVS: S1 S2 irregular, tachycardic GI: Bowel sounds present, Non tender and not distended with no gaurding, rigidity or rebound.No organomegaly Extremities: B/L Lower Ext shows no edema, both legs are warm to touch Neurology:  speech clear,Non focal, sensation is grossly intact. Psychiatric: Normal judgment and insight. Alert and oriented x 3. Normal mood. Musculoskeletal:gait appears to be normal.No digital cyanosis Skin:No Rash, warm and dry Wounds:N/A  LABS ON ADMISSION:  I have personally reviewed following labs and imaging studies  CBC: Recent Labs  Lab 09/18/18 0928  WBC 7.0  HGB 17.8*  HCT 53.7*  MCV 93.1  PLT 247    Basic Metabolic Panel: Recent Labs  Lab 09/18/18 0928  NA 141  K 4.8  CL 104  CO2 27  GLUCOSE 131*  BUN 20  CREATININE 1.30*  CALCIUM 9.5  MG 2.3    GFR: Estimated Creatinine Clearance: 88.8 mL/min (A) (by C-G formula based on SCr of 1.3 mg/dL (H)).  Liver Function Tests: No results for input(s): AST, ALT, ALKPHOS, BILITOT, PROT, ALBUMIN in the last 168 hours. No results for input(s): LIPASE, AMYLASE in the last 168 hours. No results for input(s): AMMONIA in  the last 168 hours.  Coagulation Profile: No results for input(s): INR, PROTIME in the last 168 hours.  Cardiac Enzymes: No results for input(s): CKTOTAL, CKMB, CKMBINDEX, TROPONINI in the last 168 hours.  BNP (last 3 results) No results for input(s): PROBNP in the last 8760 hours.  HbA1C: No results for input(s): HGBA1C in the last 72 hours.  CBG: No results for input(s): GLUCAP in the last 168 hours.  Lipid Profile: No results for input(s): CHOL, HDL, LDLCALC, TRIG, CHOLHDL, LDLDIRECT in the last 72 hours.  Thyroid Function Tests: No results for input(s): TSH, T4TOTAL, FREET4, T3FREE, THYROIDAB in the last 72 hours.  Anemia Panel: No results for input(s): VITAMINB12, FOLATE, FERRITIN, TIBC,  IRON, RETICCTPCT in the last 72 hours.  Urine analysis: No results found for: COLORURINE, APPEARANCEUR, LABSPEC, PHURINE, GLUCOSEU, HGBUR, BILIRUBINUR, KETONESUR, PROTEINUR, UROBILINOGEN, NITRITE, LEUKOCYTESUR  Sepsis Labs: Lactic Acid, Venous No results found for: LATICACIDVEN   Microbiology: No results found for this or any previous visit (from the past 240 hour(s)).    RADIOLOGIC STUDIES ON ADMISSION: Dg Chest Port 1 View  Result Date: 09/18/2018 CLINICAL DATA:  Atrial fibrillation with tachycardia. EXAM: PORTABLE CHEST 1 VIEW COMPARISON:  06/13/2016 FINDINGS: Artifact overlies the chest. Heart size upper limits of normal. Mediastinal shadows are normal. The lungs are clear. The vascularity is normal. No effusions. Incidental azygos fissure on the right. IMPRESSION: No active disease. Electronically Signed   By: Paulina Fusi M.D.   On: 09/18/2018 10:49    I have personally reviewed images of chest xray-no obvious consolidation  EKG:  Personally reviewed.  A. fib with RVR  ASSESSMENT AND PLAN: A. fib with RVR: Failed cardioversion x3 in the emergency room-continue Cardizem infusion-resume usual dosing of Coreg-continue Xarelto-await other recommendations from EP/cardiology.  Will obtain updated echocardiogram  AKI: Likely a sequelae of recent diarrhea-gently hydrate and recheck electrolytes tomorrow.  Hypertension: Hold losartan-to allow room for Cardizem infusion-continue Coreg.  Adjust accordingly.  Recent upper GI illness with diarrhea: Resolved  Morbid obesity  Above noted plan was discussed with patient/spouse face to face at bedside, they were in agreement.   CONSULTS: Cardiology  DVT Prophylaxis: Xarelto  Code Status: Full Code  Disposition Plan:  Discharge back home  possibly in 2 days, depending on clinical course  Admission status:  Inpatient going to SDU  The medical decision making on this patient was of high complexity and the patient is at high risk  for clinical deterioration, therefore this is a level 3 visit   Total time spent  55 minutes.Greater than 50% of this time was spent in counseling, explanation of diagnosis, planning of further management, and coordination of care.  Severity of Illness: The appropriate patient status for this patient is INPATIENT. Inpatient status is judged to be reasonable and necessary in order to provide the required intensity of service to ensure the patient's safety. The patient's presenting symptoms, physical exam findings, and initial radiographic and laboratory data in the context of their chronic comorbidities is felt to place them at high risk for further clinical deterioration. Furthermore, it is not anticipated that the patient will be medically stable for discharge from the hospital within 2 midnights of admission. The following factors support the patient status of inpatient.   " The patient's presenting symptoms include dizziness, tachycardia " The worrisome physical exam findings include A. fib with rapid ventricular response-failed cardioversion x3 in the emergency room " The initial radiographic and laboratory data are worrisome because of acute kidney injury.  The " The chronic co-morbidities include morbid obesity, hypertension, acute kidney injury  * I certify that at the point of admission it is my clinical judgment that the patient will require inpatient hospital care spanning beyond 2 midnights from the point of admission due to high intensity of service, high risk for further deterioration and high frequency of surveillance required.  Jeoffrey Massed [facility] Pager [phone]  If 7PM-7AM, please contact night-coverage [email or web address] Password [email or web address] 09/18/2018, 2:46 PM

## 2018-09-18 NOTE — Sedation Documentation (Signed)
Cardioverted at 200 joules °  °

## 2018-09-18 NOTE — ED Provider Notes (Signed)
Physical Exam  BP 119/84   Pulse (!) 41   Temp 97.9 F (36.6 C) (Oral)   Resp 12   Ht 6\' 2"  (1.88 m)   Wt 127 kg   SpO2 99%   BMI 35.95 kg/m   Physical Exam  ED Course/Procedures   Clinical Course as of Sep 18 1649  Thu Sep 18, 2018  1330 Followed up with Dr. Johney Frame with cardiology.  Discussed failed cardioversion.  Patient is now on Cardizem drip.  He recommends medical admission given patient's abdominal complaints.    [JR]  1412 Dr. Jerral Ralph accepting admission.   [JR]    Clinical Course User Index [JR] Robinson, [place] N, PA-C    .Cardioversion Date/Time: 09/18/2018 2:23 PM Performed by: Pricilla Loveless, MD Authorized by: Pricilla Loveless, MD   Consent:    Consent obtained:  Verbal and written   Consent given by:  Patient   Risks discussed:  Cutaneous burn, death, induced arrhythmia and pain Pre-procedure details:    Cardioversion basis:  Emergent   Rhythm:  Atrial fibrillation   Electrode placement:  Anterior-posterior Attempt one:    Cardioversion mode:  Synchronous   Waveform:  Biphasic   Shock (Joules):  200   Cardioversion outcome attempt one: conversion to NSR, then back to afib. Attempt two:    Cardioversion mode:  Synchronous   Shock (Joules):  200   Cardioversion outcome attempt two: conversion to NSR, then back to afib. Attempt three:    Cardioversion mode:  Synchronous   Waveform:  Biphasic   Shock (Joules):  200   Cardioversion outcome attempt three: conversion to NSR, then back to afib. Post-procedure details:    Patient status:  Awake   Patient tolerance of procedure:  Tolerated well, no immediate complications .Sedation Date/Time: 09/18/2018 2:24 PM Performed by: Pricilla Loveless, MD Authorized by: Pricilla Loveless, MD   Consent:    Consent obtained:  Verbal   Consent given by:  Patient   Risks discussed:  Allergic reaction, dysrhythmia, inadequate sedation, nausea, prolonged hypoxia resulting in organ damage, prolonged sedation  necessitating reversal, respiratory compromise necessitating ventilatory assistance and intubation and vomiting Universal protocol:    Procedure explained and questions answered to patient or proxy's satisfaction: yes     Relevant documents present and verified: yes     Test results available and properly labeled: yes     Imaging studies available: yes     Required blood products, implants, devices, and special equipment available: yes     Site/side marked: yes     Immediately prior to procedure a time out was called: yes     Patient identity confirmation method:  Verbally with patient Indications:    Procedure necessitating sedation performed by:  Physician performing sedation Pre-sedation assessment:    Time since last food or drink:  4 hours   ASA classification: class 2 - patient with mild systemic disease     Neck mobility: normal     Mouth opening:  3 or more finger widths   Thyromental distance:  4 finger widths   Mallampati score:  I - soft palate, uvula, fauces, pillars visible   Pre-sedation assessments completed and reviewed: airway patency, cardiovascular function, hydration status, mental status, nausea/vomiting, pain level, respiratory function and temperature   Immediate pre-procedure details:    Reassessment: Patient reassessed immediately prior to procedure     Reviewed: vital signs, relevant labs/tests and NPO status     Verified: bag valve mask available, emergency equipment available, intubation equipment  available, IV patency confirmed, oxygen available and suction available   Procedure details (see MAR for exact dosages):    Preoxygenation:  Nasal cannula   Sedation:  Propofol   Intra-procedure monitoring:  Blood pressure monitoring, cardiac monitor, continuous pulse oximetry, frequent LOC assessments, frequent vital sign checks and continuous capnometry   Intra-procedure events: none     Total Provider sedation time (minutes):  7 Post-procedure details:     Attendance: Constant attendance by certified staff until patient recovered     Recovery: Patient returned to pre-procedure baseline     Post-sedation assessments completed and reviewed: airway patency, cardiovascular function, hydration status, mental status, nausea/vomiting, pain level, respiratory function and temperature     Patient is stable for discharge or admission: yes     Patient tolerance:  Tolerated well, no immediate complications .Critical Care Performed by: Pricilla Loveless, MD Authorized by: Pricilla Loveless, MD   Critical care provider statement:    Critical care time (minutes):  35   Critical care time was exclusive of:  Separately billable procedures and treating other patients   Critical care was necessary to treat or prevent imminent or life-threatening deterioration of the following conditions:  Cardiac failure   Critical care was time spent personally by me on the following activities:  Development of treatment plan with patient or surrogate, discussions with consultants, evaluation of patient's response to treatment, examination of patient, obtaining history from patient or surrogate, ordering and performing treatments and interventions, ordering and review of laboratory studies, ordering and review of radiographic studies, pulse oximetry, re-evaluation of patient's condition and review of old charts    MDM  Patient presents in recurrent A. fib with RVR.  In the setting of a diarrheal illness but he has no significant electrolyte abnormality.  He was cardioverted after discussion with cardiology but he would only briefly be in sinus rhythm and then go back into A. fib with RVR.  He was thus treated with Cardizem and will need admission for further control.      Pricilla Loveless, MD 09/18/18 [phone]

## 2018-09-18 NOTE — ED Notes (Signed)
ED Provider at bedside. 

## 2018-09-18 NOTE — Consult Note (Addendum)
Cardiology Consultation:   Patient ID: Russell Arnold MRN: [id number]; DOB: 19-Nov-1960  Admit date: 09/18/2018 Date of Consult: 09/18/2018  Primary Care Provider: Patient, No Pcp Per Primary Cardiologist: No primary care provider on file. Primary Electrophysiologist:  Dr. Johney Frame   Patient Profile:   Russell Arnold is a 57 y.o. male with a hx of HTN and AFib  who is being seen today for the evaluation of AFib w/failed DCCV (x3 shocks in the ER) at the request of Ghimire.  History of Present Illness:   Russell Arnold was seen in the AFib clinic Monday, c/o not feeling well for a couple weeks, he was in AFib w/RVR (133bpm).  He was taking his coreg 12.5mg  only daily reported BID made him dizzy.  He was rxx 6.25mg  BID with plans to return today Today he returned still in rapid Af though reporting GI illness with recurrent/copious amounts of diarrhea that started Tuesday with a 10lb weight loss by the clinic scale and 20lbs per the patient and his home scale.  He was referred to the ER He reported compliance with his a/c with hopes he could be DCCV if no other issues/lyte disturbance was found.  In the ER he was shocked 3 times unable to convert him to SR and has been started on a dilt gtt.  HR remains in rapid AFib/flutter He is a/c with xarelto 20mg  daily, appropriately dosed  LABS K+ 4.8 Mag 2.3 BUN/Creat 20/1.30 poc Trop 0.01 WBC 7.0 H/H 17/53 Plts 247    Past Medical History:  Diagnosis Date  . A-fib (HCC)   . Acute diastolic heart failure (HCC) 05/2016  . Family history of adverse reaction to anesthesia   . Hypertension     Past Surgical History:  Procedure Laterality Date  . ABLATION OF DYSRHYTHMIC FOCUS  09/04/2016  . APPENDECTOMY    . ATRIAL FIBRILLATION ABLATION N/A 08/22/2017   Procedure: ATRIAL FIBRILLATION ABLATION;  Surgeon: Hillis Range, MD;  Location: [facility] INVASIVE CV LAB;  Service: Cardiovascular;  Laterality: N/A;  . CARDIOVERSION N/A 06/14/2016    Procedure: CARDIOVERSION;  Surgeon: Peter M [place], MD;  Location: [facility] ENDOSCOPY;  Service: Cardiovascular;  Laterality: N/A;  . CARDIOVERSION N/A 06/17/2016   Procedure: CARDIOVERSION;  Surgeon: Dolores Patty, MD;  Location[facility] OR;  Service: Cardiovascular;  Laterality: N/A;  . ELECTROPHYSIOLOGIC STUDY N/A 09/04/2016   Procedure: Atrial Fibrillation Ablation;  Surgeon: Hillis Range, MD;  Location: [facility] INVASIVE CV LAB;  Service: Cardiovascular;  Laterality: N/A;  . TEE WITHOUT CARDIOVERSION N/A 09/03/2016   Procedure: TRANSESOPHAGEAL ECHOCARDIOGRAM (TEE);  Surgeon: Thurmon Fair, MD;  Location: [facility] ENDOSCOPY;  Service: Cardiovascular;  Laterality: N/A;  . TEE WITHOUT CARDIOVERSION N/A 08/21/2017   Procedure: TRANSESOPHAGEAL ECHOCARDIOGRAM (TEE);  Surgeon: Wendall Stade, MD;  Location: [facility] ENDOSCOPY;  Service: Cardiovascular;  Laterality: N/A;     Home Medications:  Prior to Admission medications   Medication Sig Start Date End Date Taking? Authorizing Provider  carvedilol (COREG) 6.25 MG tablet Take 1 tablet (6.25 mg total) by mouth 2 (two) times daily. 09/15/18  Yes Newman Nip, NP  cholecalciferol (VITAMIN D) 1000 units tablet Take 1,000 Units by mouth daily.   Yes [provider]  losartan (COZAAR) 50 MG tablet Take 1 tablet (50 mg total) by mouth daily. 12/31/17  Yes Newman Nip, NP  rivaroxaban (XARELTO) 20 MG TABS tablet Take 1 tablet (20 mg total) by mouth daily with supper. 06/24/17  Yes Newman Nip, NP  sildenafil (  VIAGRA) 100 MG tablet Take 1 tablet (100 mg total) by mouth daily as needed for erectile dysfunction. 06/13/18  Yes AllredFayrene Fearing, MD    Inpatient Medications: Scheduled Meds: . propofol  1 mg/kg Intravenous Once   Continuous Infusions: . diltiazem (CARDIZEM) infusion 5 mg/hr (09/18/18 1310)   PRN Meds:   Allergies:    Allergies  Allergen Reactions  . Amoxicillin Hives    Hives - noted "red splotches" - also ineffective per pt Has  patient had a PCN reaction causing immediate rash, facial/tongue/throat swelling, SOB or lightheadedness with hypotension: No Has patient had a PCN reaction causing severe rash involving mucus membranes or skin necrosis: No Has patient had a PCN reaction that required hospitalization:No Has patient had a PCN reaction occurring within the last 10 years:Yes If all of the above answers are "NO", then may proceed with Cephalosporin use.      Social History:   Social History   Socioeconomic History  . Marital status: Married    Spouse name: Not on file  . Number of children: Not on file  . Years of education: Not on file  . Highest education level: Not on file  Occupational History  . Occupation: Engineer, technical sales Needs  . Financial resource strain: Not on file  . Food insecurity:    Worry: Not on file    Inability: Not on file  . Transportation needs:    Medical: Not on file    Non-medical: Not on file  Tobacco Use  . Smoking status: Never Smoker  . Smokeless tobacco: Former Neurosurgeon    Types: Chew  Substance and Sexual Activity  . Alcohol use: No  . Drug use: No  . Sexual activity: Not on file  Lifestyle  . Physical activity:    Days per week: Not on file    Minutes per session: Not on file  . Stress: Not on file  Relationships  . Social connections:    Talks on phone: Not on file    Gets together: Not on file    Attends religious service: Not on file    Active member of club or organization: Not on file    Attends meetings of clubs or organizations: Not on file    Relationship status: Not on file  . Intimate partner violence:    Fear of current or ex partner: Not on file    Emotionally abused: Not on file    Physically abused: Not on file    Forced sexual activity: Not on file  Other Topics Concern  . Not on file  Social History Narrative  . Not on file    Family History:   Family History  Problem Relation Age of Onset  . Heart failure Mother      ROS:    Please see the history of present illness.  All other ROS reviewed and negative.     Physical Exam/Data:   Vitals:   09/18/18 1327 09/18/18 1328 09/18/18 1329 09/18/18 1330  BP:      Pulse: 86 83 79 (!) 41  Resp: 19 15 14 12   Temp:      TempSrc:      SpO2: 98% 100% 100% 99%  Weight:      Height:       No intake or output data in the 24 hours ending 09/18/18 1449 Filed Weights   09/18/18 0914  Weight: 127 kg   Body mass index is 35.95 kg/m.  General:  Well  nourished, well developed, in no acute distress HEENT: normal Lymph: no adenopathy Neck: no JVD Endocrine:  No thryomegaly Vascular: No carotid bruits; FA pulses 2+ bilaterally without bruits  Cardiac:  Tachycardic irregular rhythm Lungs:  clear to auscultation bilaterally, no wheezing, rhonchi or rales  Abd: soft, nontender, no hepatomegaly  Ext: no edema Musculoskeletal:  No deformities, BUE and BLE strength normal and equal Skin: warm and dry  Neuro:  CNs 2-12 intact, no focal abnormalities noted Psych:  Normal affect   EKG:  The EKG was personally reviewed and demonstrates:   Looks like Aflutter, 163bpm Telemetry:  Telemetry was personally reviewed and demonstrates:   Remains in rapid aflutter 150's  Relevant CV Studies:  08/22/17: EPS/Ablation CONCLUSIONS: 1. Sinus rhythm upon presentation.  2. Intracardiac echo reveals a moderate sized left atrium. 3. Return of electrical activity within the right superior and right inferior pulmonary veins.  These veins were successfully reisolated today. The left superior and left inferior pulmonary veins were quiescent from a prior ablation and did not require additional ablation. A WACA approach was used.  4. Additional mapping and ablation within the left atrium due to persistence of atrial fibrillation with a posterior wall box demonstrated  5. Atrial flutter not inducible today with isuprel 6. Empiric Cavo-tricuspid isthmus ablation performed with complete  bidirectional isthmus block achieved 7. No inducible arrhythmias following ablation both on/ off isuprel 8. No early apparent complications.  08/21/17: TEE Study Conclusions - Left ventricle: Systolic function was normal. The estimated   ejection fraction was in the range of 55% to 60%. - Left atrium: The atrium was dilated. No evidence of thrombus in   the atrial cavity or appendage. - Right atrium: No evidence of thrombus in the atrial cavity or   appendage. - Atrial septum: No defect or patent foramen ovale was identified. - Impressions: No LAA thrombus ok for ablation in am with Dr   Johney Frame. Impressions - No LAA thrombus ok for ablation in am with Dr Johney Frame.   Laboratory Data:  Chemistry Recent Labs  Lab 09/18/18 0928  NA 141  K 4.8  CL 104  CO2 27  GLUCOSE 131*  BUN 20  CREATININE 1.30*  CALCIUM 9.5  GFRNONAA >60  GFRAA >60  ANIONGAP 10    No results for input(s): PROT, ALBUMIN, AST, ALT, ALKPHOS, BILITOT in the last 168 hours. Hematology Recent Labs  Lab 09/18/18 0928  WBC 7.0  RBC 5.77  HGB 17.8*  HCT 53.7*  MCV 93.1  MCH 30.8  MCHC 33.1  RDW 12.3  PLT 247   Cardiac EnzymesNo results for input(s): TROPONINI in the last 168 hours.  Recent Labs  Lab 09/18/18 0958  TROPIPOC 0.01    BNPNo results for input(s): BNP, PROBNP in the last 168 hours.  DDimer No results for input(s): DDIMER in the last 168 hours.  Radiology/Studies:   Dg Chest Port 1 View Result Date: 09/18/2018 CLINICAL DATA:  Atrial fibrillation with tachycardia. EXAM: PORTABLE CHEST 1 VIEW COMPARISON:  06/13/2016 FINDINGS: Artifact overlies the chest. Heart size upper limits of normal. Mediastinal shadows are normal. The lungs are clear. The vascularity is normal. No effusions. Incidental azygos fissure on the right. IMPRESSION: No active disease. Electronically Signed   By: Paulina Fusi M.D.   On: 09/18/2018 10:49    Assessment and Plan:   1. AFlutter w/RVR, hx of AFib (s/p Abl  x2)     CHA2DS2Vasc is one, on xarelto     Failed DCCV despite  3 shocks here in the ED His acute issues with arrhythmia are likely due to URI and recent diarrhea.  He is having intermittent sinus rhythm on telemetry. Continue IV diltiazem and anticoagulation with xarelto. I will given flecainide 300mg  PO x 1 now Hopefully he will convert to sinus rhythm overnight. Would likely benefit from flecainide 100mg  BID for the next few weeks while recovering from his illness.  2. HTN Stable No change required today  I will see again in the am    Hillis Range MD, [facility] 09/18/2018 4:08 PM

## 2018-09-18 NOTE — Sedation Documentation (Signed)
Cardioverted x1 at 200 joules

## 2018-09-18 NOTE — ED Provider Notes (Addendum)
[facility] EMERGENCY DEPARTMENT Provider Note   CSN: [id number] Arrival date & time: 09/18/18  1191     History   Chief Complaint Chief Complaint  Patient presents with  . Atrial Fibrillation  . Tachycardia    HPI Russell Arnold is a 57 y.o. male with past medical history of paroxysmal atrial fibrillation, hypertension, presenting to the emergency department from atrial fibrillation clinic with complaint of atrial fibrillation with RVR.  Patient was evaluated on Monday for a follow-up appointment.  He was noted to be in sinus rhythm during that time.  Patient states last week he had an upper respiratory infection and took some decongestants.  After his visit on Monday, he states he had a GI virus on Tuesday which consisted of frequent episodes of diarrhea.  He denies associated nausea, vomiting, abdominal pain.  He states his diarrhea has since subsided, however he lost at least 10 pounds from the illness.  States he drank quite a bit of water yesterday to try to rehydrate himself.  He was evaluated in the A. fib clinic again today, and noted to have a heart rate of 160 and was normotensive.  Patient sent from A. fib clinic for evaluation and likely cardioversion.  Patient states today he began feeling sweaty though thought he may just have a fever.  He does feel lightheaded as well.  He has been compliant with his Xarelto and has not missed any doses.  He denies chest pain, shortness of breath, headache, or other complaints.  Per chart review, recently had a dose change of his carvedilol from 12.5mg  to 6.25 mg twice daily due to intolerance.   The history is provided by the patient and medical records.    Past Medical History:  Diagnosis Date  . A-fib (HCC)   . Acute diastolic heart failure (HCC) 05/2016  . Family history of adverse reaction to anesthesia   . Hypertension     Patient Active Problem List   Diagnosis Date Noted  . AKI (acute kidney injury)  (HCC) 09/18/2018  . PAF (paroxysmal atrial fibrillation) (HCC) 09/04/2016  . Persistent atrial fibrillation   . Tachycardia induced cardiomyopathy (HCC)   . Acute systolic heart failure (HCC)   . Atrial fibrillation with RVR (HCC) 06/13/2016  . Acute diastolic CHF (congestive heart failure) (HCC) 06/13/2016  . Essential hypertension 06/13/2016  . Chronic anticoagulation-Xarelto 06/13/2016  . Hypertensive heart disease with congestive heart failure (HCC) 06/13/2016    Past Surgical History:  Procedure Laterality Date  . ABLATION OF DYSRHYTHMIC FOCUS  09/04/2016  . APPENDECTOMY    . ATRIAL FIBRILLATION ABLATION N/A 08/22/2017   Procedure: ATRIAL FIBRILLATION ABLATION;  Surgeon: Hillis Range, MD;  Location: [facility] INVASIVE CV LAB;  Service: Cardiovascular;  Laterality: N/A;  . CARDIOVERSION N/A 06/14/2016   Procedure: CARDIOVERSION;  Surgeon: Peter M Swazil[place], MD;  Location: [facility] ENDOSCOPY;  Service: Cardiovascular;  Laterality: N/A;  . CARDIOVERSION N/A 06/17/2016   Procedure: CARDIOVERSION;  Surgeon: Dolores Patty, MD;  Location: [facility] OR;  Service: Cardiovascular;  Laterality: N/A;  . ELECTROPHYSIOLOGIC STUDY N/A 09/04/2016   Procedure: Atrial Fibrillation Ablation;  Surgeon: Hillis Range, MD;  Location: [facility] INVASIVE CV LAB;  Service: Cardiovascular;  Laterality: N/A;  . TEE WITHOUT CARDIOVERSION N/A 09/03/2016   Procedure: TRANSESOPHAGEAL ECHOCARDIOGRAM (TEE);  Surgeon: Thurmon Fair, MD;  Location: [facility] ENDOSCOPY;  Service: Cardiovascular;  Laterality: N/A;  . TEE WITHOUT CARDIOVERSION N/A 08/21/2017   Procedure: TRANSESOPHAGEAL ECHOCARDIOGRAM (TEE);  Surgeon: Wendall Stade, MD;  Location: [facility] ENDOSCOPY;  Service: Cardiovascular;  Laterality: N/A;        Home Medications    Prior to Admission medications   Medication Sig Start Date End Date Taking? Authorizing Provider  carvedilol (COREG) 6.25 MG tablet Take 1 tablet (6.25 mg total) by mouth 2 (two) times daily. 09/15/18  Yes Newman Nip, NP  cholecalciferol (VITAMIN D) 1000 units tablet Take 1,000 Units by mouth daily.   Yes [provider]  losartan (COZAAR) 50 MG tablet Take 1 tablet (50 mg total) by mouth daily. 12/31/17  Yes Newman Nip, NP  rivaroxaban (XARELTO) 20 MG TABS tablet Take 1 tablet (20 mg total) by mouth daily with supper. 06/24/17  Yes Newman Nip, NP  sildenafil (VIAGRA) 100 MG tablet Take 1 tablet (100 mg total) by mouth daily as needed for erectile dysfunction. 06/13/18  Yes AllredFayrene Fearing, MD    Family History Family History  Problem Relation Age of Onset  . Heart failure Mother     Social History Social History   Tobacco Use  . Smoking status: Never Smoker  . Smokeless tobacco: Former Neurosurgeon    Types: Chew  Substance Use Topics  . Alcohol use: No  . Drug use: No     Allergies   Amoxicillin   Review of Systems Review of Systems  Constitutional: Positive for diaphoresis.  Respiratory: Negative for shortness of breath.   Cardiovascular: Negative for chest pain and palpitations.  Gastrointestinal: Positive for diarrhea (Resolved). Negative for abdominal pain.  Neurological: Positive for light-headedness.  All other systems reviewed and are negative.    Physical Exam Updated Vital Signs BP 119/84   Pulse (!) 41   Temp 97.9 F (36.6 C) (Oral)   Resp 12   Ht 6\' 2"  (1.88 m)   Wt 127 kg   SpO2 99%   BMI 35.95 kg/m   Physical Exam  Constitutional: He appears well-developed and well-nourished. No distress.  HENT:  Head: Normocephalic and atraumatic.  Mouth/Throat: Oropharynx is clear and moist.  Eyes: Conjunctivae are normal.  Neck: Normal range of motion. Neck supple. No JVD present. No tracheal deviation present.  Cardiovascular: Normal heart sounds and intact distal pulses.  Heart rate 160s.  Irregular.  Pulmonary/Chest: Effort normal and breath sounds normal. No respiratory distress.  Abdominal: Soft. Bowel sounds are normal. He exhibits no  distension. There is no tenderness. There is no rebound and no guarding.  Musculoskeletal: He exhibits no edema.  Neurological: He is alert.  Skin: Skin is warm. He is diaphoretic.  Psychiatric: He has a normal mood and affect. His behavior is normal.  Nursing note and vitals reviewed.    ED Treatments / Results  Labs (all labs ordered are listed, but only abnormal results are displayed) Labs Reviewed  BASIC METABOLIC PANEL - Abnormal; Notable for the following components:      Result Value   Glucose, Bld 131 (*)    Creatinine, Ser 1.30 (*)    All other components within normal limits  CBC - Abnormal; Notable for the following components:   Hemoglobin 17.8 (*)    HCT 53.7 (*)    All other components within normal limits  MAGNESIUM  COMPREHENSIVE METABOLIC PANEL  I-STAT TROPONIN, ED    EKG None  Radiology Dg Chest Port 1 View  Result Date: 09/18/2018 CLINICAL DATA:  Atrial fibrillation with tachycardia. EXAM: PORTABLE CHEST 1 VIEW COMPARISON:  06/13/2016 FINDINGS: Artifact overlies the chest. Heart size upper limits of normal.  Mediastinal shadows are normal. The lungs are clear. The vascularity is normal. No effusions. Incidental azygos fissure on the right. IMPRESSION: No active disease. Electronically Signed   By: Paulina Fusi M.D.   On: 09/18/2018 10:49    Procedures Procedures (including critical care time) See Sedation and procedure note, by Dr. Criss Alvine.  CRITICAL CARE Performed by: [place] N    Total critical care time: 45 minutes  Critical care time was exclusive of separately billable procedures and treating other patients.  Critical care was necessary to treat or prevent imminent or life-threatening deterioration.  Critical care was time spent personally by me on the following activities: development of treatment plan with patient and/or surrogate as well as nursing, discussions with consultants, evaluation of patient's response to treatment,  examination of patient, obtaining history from patient or surrogate, ordering and performing treatments and interventions, ordering and review of laboratory studies, ordering and review of radiographic studies, pulse oximetry and re-evaluation of patient's condition. Medications Ordered in ED Medications  propofol (DIPRIVAN) 10 mg/mL bolus/IV push 127 mg (has no administration in time range)  diltiazem (CARDIZEM) 1 mg/mL load via infusion 20 mg (20 mg Intravenous Bolus from Bag 09/18/18 1309)    And  diltiazem (CARDIZEM) 100 mg in dextrose 5% (1 mg/mL) infusion (5 mg/hr Intravenous New Bag/Given 09/18/18 1310)  sodium chloride 0.9 % bolus 1,000 mL (0 mLs Intravenous Stopped 09/18/18 1240)  metoprolol tartrate (LOPRESSOR) injection 2.5 mg (2.5 mg Intravenous Given 09/18/18 1040)  sodium chloride 0.9 % bolus 1,000 mL (0 mLs Intravenous Stopped 09/18/18 1501)  propofol (DIPRIVAN) 10 mg/mL bolus/IV push (100 mg Intravenous Given 09/18/18 1238)     Initial Impression / Assessment and Plan / ED Course  I have reviewed the triage vital signs and the nursing notes.  Pertinent labs & imaging results that were available during my care of the patient were reviewed by me and considered in my medical decision making (see chart for details).  Clinical Course as of Sep 19 1527  Thu Sep 18, 2018  1330 Followed up with Dr. Johney Frame with cardiology.  Discussed failed cardioversion.  Patient is now on Cardizem drip.  He recommends medical admission given patient's abdominal complaints.    [JR]  1412 Dr. Jerral Ralph accepting admission.   [JR]    Clinical Course User Index [JR] , [place] N, PA-C    Patient presenting to the ED from the A. fib clinic in atrial fibrillation with RVR.  Patient with paroxysmal atrial fibrillation, rate controlled by carvedilol.  He had what appears to be viral gastroenteritis this week consisted of diarrhea without abdominal pain.  The symptoms have subsided.  He was sent  from the A. fib clinic for labs and likely cardioversion.  On exam, he is diaphoretic, however without complaints.  His rate is in the 160s, irregular rhythm.  Denies chest pains or other associated symptoms.  Labs ordered.  Potassium is within normal limits.  CBC and creatinine showing evidence of mild dehydration.  Troponin negative.  IV fluids administered.  Dr. Johney Frame saw patient in the ED.   Attempted cardioversion 3 times under conscious sedation.  Patient converted to sinus rhythm with cardioversion, however patient quickly converted back to atrial fibrillation with RVR within 1-2 minutes after each attempt.  Followed back up with Dr. Johney Frame, who recommends medical admission. Pt started on cardizem drip and admitted.  Patient discussed with and seen by Dr. Criss Alvine.     CHA2DS2/VAS Stroke Risk Points  Current as of  26 minutes ago     2 >= 2 Points: High Risk  1 - 1.99 Points: Medium Risk  0 Points: Low Risk    This is the only CHA2DS2/VAS Stroke Risk Points available for the past  year.:  Last Change: N/A     Details    This score determines the patient's risk of having a stroke if the  patient has atrial fibrillation.       Points Metrics  1 Has Congestive Heart Failure:  Yes    Current as of 26 minutes ago  0 Has Vascular Disease:  No    Current as of 26 minutes ago  1 Has Hypertension:  Yes    Current as of 26 minutes ago  0 Age:  58    Current as of 26 minutes ago  0 Has Diabetes:  No    Current as of 26 minutes ago  0 Had Stroke:  No  Had TIA:  No  Had thromboembolism:  No    Current as of 26 minutes ago  0 Male:  No    Current as of 26 minutes ago   Final Clinical Impressions(s) / ED Diagnoses   Final diagnoses:  Atrial fibrillation with RVR [facility])    ED Discharge Orders    None       , [place] N, PA-C 09/18/18 1527    , [place] N, PA-C 09/18/18 1529    Pricilla Loveless, MD 09/19/18 0740

## 2018-09-18 NOTE — Progress Notes (Signed)
Pt in for EKG for finding afib with v rate of 130's on Monday, he was only taking 12.5 mg qd. He said higher doses made him dizzy. I changed to 6.25 mg bid.  He is now saying that he had a GI virus  that started Tuesday and had copious amounts of diarrhea. He has lost 10 lb by our scales, 20 lbs without clothes by his scales at home.Russell Arnold He now has a HR of 160 in afib and BP very faintly audible at 138/80 systolic. He is diaphoretic. He has been having dizzy spells. He has not missed any doses of anticoagulation. I feel that he needs to go to the ER and have labs checked for electrolyte disturbance/dehydration and if OK then  proceed to cardioversion.

## 2018-09-18 NOTE — ED Triage Notes (Signed)
Patient here after a-fib clinic visit this morning where they discovered that his HR was 163 on the EKG. Pt reports he has had a cold x2 weeks and reports taking sudafed the first couple of days for symptom relief. Reports diarrhea since Tuesday. Denies cp/sob. Appears to be slightly diaphoretic. HR 165. BP 141/101.

## 2018-09-19 ENCOUNTER — Observation Stay (HOSPITAL_BASED_OUTPATIENT_CLINIC_OR_DEPARTMENT_OTHER): Payer: BLUE CROSS/BLUE SHIELD

## 2018-09-19 DIAGNOSIS — I361 Nonrheumatic tricuspid (valve) insufficiency: Secondary | ICD-10-CM

## 2018-09-19 DIAGNOSIS — I4891 Unspecified atrial fibrillation: Secondary | ICD-10-CM | POA: Diagnosis not present

## 2018-09-19 DIAGNOSIS — I4892 Unspecified atrial flutter: Secondary | ICD-10-CM | POA: Diagnosis not present

## 2018-09-19 DIAGNOSIS — I370 Nonrheumatic pulmonary valve stenosis: Secondary | ICD-10-CM

## 2018-09-19 DIAGNOSIS — I10 Essential (primary) hypertension: Secondary | ICD-10-CM | POA: Diagnosis not present

## 2018-09-19 LAB — BASIC METABOLIC PANEL
Anion gap: 9 (ref 5–15)
BUN: 20 mg/dL (ref 6–20)
CALCIUM: 8.4 mg/dL — AB (ref 8.9–10.3)
CO2: 23 mmol/L (ref 22–32)
CREATININE: 1.1 mg/dL (ref 0.61–1.24)
Chloride: 107 mmol/L (ref 98–111)
GFR calc non Af Amer: >60 mL/min (ref >60)
Glucose, Bld: 116 mg/dL — ABNORMAL HIGH (ref 70–99)
Potassium: 4.1 mmol/L (ref 3.5–5.1)
Sodium: 139 mmol/L (ref 135–145)

## 2018-09-19 LAB — CBC
HCT: 43.2 % (ref 39.0–52.0)
Hemoglobin: 14.4 g/dL (ref 13.0–17.0)
MCH: 30.8 pg (ref 26.0–34.0)
MCHC: 33.3 g/dL (ref 30.0–36.0)
MCV: 92.3 fL (ref 80.0–100.0)
NRBC: 0 % (ref 0.0–0.2)
Platelets: 211 10*3/uL (ref 150–400)
RBC: 4.68 MIL/uL (ref 4.22–5.81)
RDW: 12.6 % (ref 11.5–15.5)
WBC: 8.6 10*3/uL (ref 4.0–10.5)

## 2018-09-19 LAB — ECHOCARDIOGRAM COMPLETE
Height: 74 in
WEIGHTICAEL: 4708.8 [oz_av]

## 2018-09-19 LAB — HIV ANTIBODY (ROUTINE TESTING W REFLEX): HIV Screen 4th Generation wRfx: NONREACTIVE

## 2018-09-19 MED ORDER — FLECAINIDE ACETATE 100 MG PO TABS
100.0000 mg | ORAL_TABLET | Freq: Two times a day (BID) | ORAL | Status: DC
  Administered 2018-09-19: 100 mg via ORAL
  Filled 2018-09-19 (×2): qty 1

## 2018-09-19 MED ORDER — FLECAINIDE ACETATE 100 MG PO TABS: 100.0000 mg | ORAL_TABLET | Freq: Two times a day (BID) | ORAL | 0 refills | Status: DC

## 2018-09-19 MED FILL — FLECAINIDE ACETATE 100 MG T: 100 | 30 days supply | Qty: 60 | Fill #0

## 2018-09-19 NOTE — Progress Notes (Signed)
  Echocardiogram 2D Echocardiogram has been performed.  Russell Arnold 09/19/2018, 1:23 PM

## 2018-09-19 NOTE — Progress Notes (Addendum)
Progress Note  Patient Name: Russell Arnold Date of Encounter: 09/19/2018  Primary Cardiologist: Dr. Rosemary Holms  Subjective   Feels well, minimal cough, no diarrhea  Inpatient Medications    Scheduled Meds: . carvedilol  6.25 mg Oral BID WC  . cholecalciferol  1,000 Units Oral Daily  . rivaroxaban  20 mg Oral Q supper  . sodium chloride flush  3 mL Intravenous Q12H   Continuous Infusions: . sodium chloride    . sodium chloride 75 mL/hr at 09/18/18 1703  . diltiazem (CARDIZEM) infusion 5 mg/hr (09/18/18 2106)   PRN Meds: sodium chloride, acetaminophen **OR** acetaminophen, albuterol, ondansetron **OR** ondansetron (ZOFRAN) IV, polyethylene glycol, sodium chloride flush   Vital Signs    Vitals:   09/18/18 1827 09/18/18 2050 09/19/18 0026 09/19/18 0530  BP: 109/86 131/86 126/81 131/85  Pulse:  70 72 74  Resp: (!) 23 18 18 18   Temp:  97.7 F (36.5 C) 98 F (36.7 C) 97.9 F (36.6 C)  TempSrc:  Oral Oral Oral  SpO2:  100% 100% 97%  Weight:    133.5 kg  Height:        Intake/Output Summary (Last 24 hours) at 09/19/2018 0741 Last data filed at 09/18/2018 1501 Gross per 24 hour  Intake 1000 ml  Output -  Net 1000 ml   Filed Weights   09/18/18 0914 09/19/18 0530  Weight: 127 kg 133.5 kg    Telemetry    SR 70s, had conversion pause 7.57 sec - Personally Reviewed  ECG    SR 71bpm - Personally Reviewed  Physical Exam   GEN: No acute distress.   Neck: No JVD Cardiac: RRR, no murmurs, rubs, or gallops.  Respiratory: CTA b/l. GI: Soft, nontender, non-distended  MS: No edema; No deformity. Neuro:  Nonfocal  Psych: Normal affect   Labs    Chemistry Recent Labs  Lab 09/18/18 0928 09/19/18 0210  NA 141 139  K 4.8 4.1  CL 104 107  CO2 27 23  GLUCOSE 131* 116*  BUN 20 20  CREATININE 1.30* 1.10  CALCIUM 9.5 8.4*  GFRNONAA >60 >60  GFRAA >60 >60  ANIONGAP 10 9     Hematology Recent Labs  Lab 09/18/18 0928 09/19/18 0210  WBC 7.0 8.6    RBC 5.77 4.68  HGB 17.8* 14.4  HCT 53.7* 43.2  MCV 93.1 92.3  MCH 30.8 30.8  MCHC 33.1 33.3  RDW 12.3 12.6  PLT 247 211    Cardiac EnzymesNo results for input(s): TROPONINI in the last 168 hours.  Recent Labs  Lab 09/18/18 0958  TROPIPOC 0.01     BNPNo results for input(s): BNP, PROBNP in the last 168 hours.   DDimer No results for input(s): DDIMER in the last 168 hours.   Radiology    Dg Chest Port 1 View Result Date: 09/18/2018 CLINICAL DATA:  Atrial fibrillation with tachycardia. EXAM: PORTABLE CHEST 1 VIEW COMPARISON:  06/13/2016 FINDINGS: Artifact overlies the chest. Heart size upper limits of normal. Mediastinal shadows are normal. The lungs are clear. The vascularity is normal. No effusions. Incidental azygos fissure on the right. IMPRESSION: No active disease. Electronically Signed   By: Paulina Fusi M.D.   On: 09/18/2018 10:49    Cardiac Studies   08/22/17: EPS/Ablation CONCLUSIONS: 1. Sinus rhythm upon presentation.  2. Intracardiac echo reveals a moderate sized left atrium. 3. Return of electrical activity within the right superior and right inferior pulmonary veins. These veins were successfully reisolated today. The left superior  and left inferior pulmonary veins were quiescent from a prior ablation and did not require additional ablation. A WACA approach was used.  4. Additional mapping and ablation within the left atrium due to persistence of atrial fibrillation with a posterior wall box demonstrated  5. Atrial flutter not inducible today with isuprel 6. Empiric Cavo-tricuspid isthmus ablation performed with complete bidirectional isthmus block achieved 7. No inducible arrhythmias following ablation both on/ off isuprel 8. No early apparent complications.  08/21/17: TEE Study Conclusions - Left ventricle: Systolic function was normal. The estimated ejection fraction was in the range of 55% to 60%. - Left atrium: The atrium was dilated. No evidence of  thrombus in the atrial cavity or appendage. - Right atrium: No evidence of thrombus in the atrial cavity or appendage. - Atrial septum: No defect or patent foramen ovale was identified. - Impressions: No LAA thrombus ok for ablation in am with Dr Johney Frame. Impressions - No LAA thrombus ok for ablation in am with Dr Johney Frame.  Patient Profile     57 y.o. male with a hx of HTN and AFib, admitted with AFib/flutter w/RVR and GI illness  Assessment & Plan     1. AFlutter w/RVR, hx of AFib (s/p Abl x2)     CHA2DS2Vasc is one, on xarelto, appropriately dosed     Failed DCCV despite 3 shocks here in the ED  His acute issues with arrhythmia felt likely due to URI and recent diarrhea.  was given flecainide 300mg  yesterday  He had a conversion pause last night, has maintained SR since, sinus rates 70's Had 3.5-4 seconds to a escape V beat, 7.5 seconds to 1st narrow/junctional beat, followed by junctional brady transiently to SR  Pt reports being awake when RN came to check on him, no symptoms Stop dilt gtt  He is feeling better from GI and URI standpoint, minimal cough persists  Conversion pause was quite long, though no symptoms, Dr. Johney Frame felt likely the dilt gtt Dr. Johney Frame has seen the patient this morning.  OK from our perspective to discharge home Continue same carvedilol 6.25mg  and his xarelto ADD Flecainide 100mg  BID  AFib clinic follow up is in place  2. HTN Stable No change required today  3. GI/URI illness     C/w IM service    For questions or updates, please contact [facility] Please consult [email or web address] for contact info under        Signed, Sheilah Pigeon, PA-C  09/19/2018, 7:41 AM     I have seen, examined the patient, and reviewed the above assessment and plan.  Changes to above are made where necessary.  On exam, RRR.  Doing well s/p flecainide "pill in pocket".  Start flecainide 100mg  BID.  DC to home.  Follow-up in 1-2 weeks in AF  clinic.  Co Sign: Hillis Range, MD 09/19/2018 11:07 PM

## 2018-09-19 NOTE — Discharge Summary (Signed)
Russell Arnold, is a 57 y.o. male  DOB 1961-10-14  MRN [id number].  Admission date:  09/18/2018  Admitting Physician  Maretta Bees, MD  Discharge Date:  09/19/2018   Primary MD  Patient, No Pcp Per  Recommendations for primary care physician for things to follow:  -Check CBC, BMP during next visit -Patient to follow with EP as an outpatient, appointment scheduled for atrial fibrillation clinic   Admission Diagnosis  Atrial fibrillation with RVR (HCC) [I48.91]   Discharge Diagnosis  Atrial fibrillation with RVR (HCC) [I48.91]    Principal Problem:   Atrial fibrillation with RVR (HCC) Active Problems:   Essential hypertension   AKI (acute kidney injury) (HCC)      Past Medical History:  Diagnosis Date  . A-fib (HCC)   . Acute diastolic heart failure (HCC) 05/2016  . Family history of adverse reaction to anesthesia   . Hypertension     Past Surgical History:  Procedure Laterality Date  . ABLATION OF DYSRHYTHMIC FOCUS  09/04/2016  . APPENDECTOMY    . ATRIAL FIBRILLATION ABLATION N/A 08/22/2017   Procedure: ATRIAL FIBRILLATION ABLATION;  Surgeon: Hillis Range, MD;  Location: [facility] INVASIVE CV LAB;  Service: Cardiovascular;  Laterality: N/A;  . CARDIOVERSION N/A 06/14/2016   Procedure: CARDIOVERSION;  Surgeon: Peter M Swaziland, MD;  Location: [facility] ENDOSCOPY;  Service: Cardiovascular;  Laterality: N/A;  . CARDIOVERSION N/A 06/17/2016   Procedure: CARDIOVERSION;  Surgeon: Dolores Patty, MD;  Location: [facility] OR;  Service: Cardiovascular;  Laterality: N/A;  . ELECTROPHYSIOLOGIC STUDY N/A 09/04/2016   Procedure: Atrial Fibrillation Ablation;  Surgeon: Hillis Range, MD;  Location: [facility] INVASIVE CV LAB;  Service: Cardiovascular;  Laterality: N/A;  . TEE WITHOUT CARDIOVERSION N/A 09/03/2016   Procedure: TRANSESOPHAGEAL ECHOCARDIOGRAM (TEE);  Surgeon: Thurmon Fair, MD;  Location: [facility] ENDOSCOPY;   Service: Cardiovascular;  Laterality: N/A;  . TEE WITHOUT CARDIOVERSION N/A 08/21/2017   Procedure: TRANSESOPHAGEAL ECHOCARDIOGRAM (TEE);  Surgeon: Wendall Stade, MD;  Location: [facility] ENDOSCOPY;  Service: Cardiovascular;  Laterality: N/A;       History of present illness and  Hospital Course:     Kindly see H&P for history of present illness and admission details, please review complete Labs, Consult reports and Test reports for all details in brief  HPI  from the history and physical done on the day of admission 09/18/2018  Russell Arnold is a 57 y.o. male with medical history significant of PAF, hypertension, chronic diastolic heart failure who presented to the hospital as a referral from the A. fib clinic for management of A. fib with RVR.  Per patient, last week he had a common head cold-following which on Tuesday he started having diarrhea that resolved spontaneously.  He had seen nurse practitioner at A. fib clinic where he was found to have atrial fibrillation-was told to take Coreg twice a day (was taking once a day).  He was again followed up at the A. fib clinic this morning where he was found to be in RVR and  probably referred to the emergency room.  Patient denies any chest pain, shortness of breath or palpitations.  He no longer has any nausea, vomiting or diarrhea. Patient does acknowledge dizzy spells intermittently for the past few days.  ED Course:  Found to be in A. fib with RVR-since he was on chronic anticoagulation-he was cardioverted x3 but remained in A. fib.  He was subsequently started on a Cardizem infusion and referred to the hospitalist service for admission.  ED MD has already consulted cardiology.   Hospital Course   A flutter with RVR -Failed DCCV by 3 shocks while in ED on admission, she is with known history of A. fib, status post ablation x2,  -Seen by EP, started on diltiazem drip overnight, received flecainide 300 mg oral once on admission, he is  on Coreg 6.25 mg p.o. twice daily, he converted to normal sinus rhythm overnight.  He maintained sinus rhythm since, discussed with cardiology, recommendation to continue with patient 100 mg oral twice daily, to resume his Coreg and Xarelto on discharge. -Patient had a pause overnight, total of 7.5 seconds, was interrupted by 1 escape repeat, transition to junctional bradycardia, then to sinus rhythm, it was felt to be secondary to Cardizem drip per cardiology.  To discharge per cardiology -Reports using Sudafed for nasal congestion, I have instructed not to use anymore -Low with A. fib clinic in 10 days, appointment has been scheduled  Hypertension -Continue with home meds   Recent upper GI illness with diarrhea: Resolved    Discharge Condition:  stable   Follow UP  Follow-up Information    [facility] Follow up.   Specialty:  Cardiology Why:  09/29/18 @ 10:45AM Contact information: [address] [id number] [id number] [address] [phone]       Farris Has, MD Follow up on 09/26/2018.   Specialty:  Family Medicine Why:  11:15 for hospital follow up  Contact information: [address] [place] [address] [phone]             Discharge Instructions  and  Discharge Medications     Discharge Instructions    Discharge instructions   Complete by:  As directed    Follow with Primary MD Patient, No Pcp Per in 7 days   Get CBC, CMP, 2 view Chest X ray checked  by Primary MD next visit.    Activity: As tolerated with Full fall precautions use walker/cane & assistance as needed   Disposition Home    Diet: Heart Healthy  , with feeding assistance and aspiration precautions.  For Heart failure patients - Check your Weight same time everyday, if you gain over 2 pounds, or you develop in leg swelling, experience more shortness of breath or chest pain, call your Primary MD immediately.  Follow Cardiac Low Salt Diet and 1.5 lit/day fluid restriction.   On your next visit with your primary care physician please Get Medicines reviewed and adjusted.   Please request your Prim.MD to go over all Hospital Tests and Procedure/Radiological results at the follow up, please get all Hospital records sent to your Prim MD by signing hospital release before you go home.   If you experience worsening of your admission symptoms, develop shortness of breath, life threatening emergency, suicidal or homicidal thoughts you must seek medical attention immediately by calling 911 or calling your MD immediately  if symptoms less severe.  You Must read complete instructions/literature along with all the possible adverse  reactions/side effects for all the Medicines you take and that have been prescribed to you. Take any new Medicines after you have completely understood and accpet all the possible adverse reactions/side effects.   Do not drive, operating heavy machinery, perform activities at heights, swimming or participation in water activities or provide baby sitting services if your were admitted for syncope or siezures until you have seen by Primary MD or a Neurologist and advised to do so again.  Do not drive when taking Pain medications.    Do not take more than prescribed Pain, Sleep and Anxiety Medications  Special Instructions: If you have smoked or chewed Tobacco  in the last 2 yrs please stop smoking, stop any regular Alcohol  and or any Recreational drug use.  Wear Seat belts while driving.   Please note  You were cared for by a hospitalist during your hospital stay. If you have any questions about your discharge medications or the care you received while you were in the hospital after you are discharged, you can call the unit and asked to speak with the hospitalist on call if the hospitalist that took care of you is not available. Once you are discharged, your primary care physician  will handle any further medical issues. Please note that NO REFILLS for any discharge medications will be authorized once you are discharged, as it is imperative that you return to your primary care physician (or establish a relationship with a primary care physician if you do not have one) for your aftercare needs so that they can reassess your need for medications and monitor your lab values.   Increase activity slowly   Complete by:  As directed      Allergies as of 09/19/2018      Reactions   Amoxicillin Hives   Hives - noted "red splotches" - also ineffective per pt Has patient had a PCN reaction causing immediate rash, facial/tongue/throat swelling, SOB or lightheadedness with hypotension: No Has patient had a PCN reaction causing severe rash involving mucus membranes or skin necrosis: No Has patient had a PCN reaction that required hospitalization:No Has patient had a PCN reaction occurring within the last 10 years:Yes If all of the above answers are "NO", then may proceed with Cephalosporin use.      Medication List    TAKE these medications   carvedilol 6.25 MG tablet Commonly known as:  COREG Take 1 tablet (6.25 mg total) by mouth 2 (two) times daily.   cholecalciferol 1000 units tablet Commonly known as:  VITAMIN D Take 1,000 Units by mouth daily.   flecainide 100 MG tablet Commonly known as:  TAMBOCOR Take 1 tablet (100 mg total) by mouth every 12 (twelve) hours.   losartan 50 MG tablet Commonly known as:  COZAAR Take 1 tablet (50 mg total) by mouth daily.   rivaroxaban 20 MG Tabs tablet Commonly known as:  XARELTO Take 1 tablet (20 mg total) by mouth daily with supper.   sildenafil 100 MG tablet Commonly known as:  VIAGRA Take 1 tablet (100 mg total) by mouth daily as needed for erectile dysfunction.         Diet and Activity recommendation: See Discharge Instructions above   Consults obtained -  EP cardiollogy   Major procedures and Radiology  Reports - PLEASE review detailed and final reports for all details, in brief -     Dg Chest Port 1 View  Result Date: 09/18/2018 CLINICAL DATA:  Atrial fibrillation with tachycardia. EXAM:  PORTABLE CHEST 1 VIEW COMPARISON:  06/13/2016 FINDINGS: Artifact overlies the chest. Heart size upper limits of normal. Mediastinal shadows are normal. The lungs are clear. The vascularity is normal. No effusions. Incidental azygos fissure on the right. IMPRESSION: No active disease. Electronically Signed   By: Paulina Fusi M.D.   On: 09/18/2018 10:49    Micro Results     No results found for this or any previous visit (from the past 240 hour(s)).     Today   Subjective:   Rhylan Kagel today has no headache,no chest or abdominal pain,no new weakness tingling or numbness, feels much better wants to go home today.   Objective:   Blood pressure (!) 143/96, pulse 91, temperature 97.7 F (36.5 C), temperature source Oral, resp. rate 18, height 6\' 2"  (1.88 m), weight 133.5 kg, SpO2 100 %.   Intake/Output Summary (Last 24 hours) at 09/19/2018 1147 Last data filed at 09/18/2018 1501 Gross per 24 hour  Intake 1000 ml  Output -  Net 1000 ml    Exam Awake Alert, Oriented x 3, No new F.N deficits, Normal affect Symmetrical Chest wall movement, Good air movement bilaterally, CTAB RRR,No Gallops,Rubs or new Murmurs, No Parasternal Heave +ve B.Sounds, Abd Soft, Non tender, No organomegaly appriciated, No rebound -guarding or rigidity. No Cyanosis, Clubbing or edema, No new Rash or bruise  Data Review   CBC w Diff:  Lab Results  Component Value Date   WBC 8.6 09/19/2018   HGB 14.4 09/19/2018   HGB 14.4 08/05/2017   HCT 43.2 09/19/2018   HCT 42.1 08/05/2017   PLT 211 09/19/2018   PLT 170 08/05/2017   LYMPHOPCT 23 08/28/2016   MONOPCT 6 08/28/2016   EOSPCT 3 08/28/2016   BASOPCT 1 08/28/2016    CMP:  Lab Results  Component Value Date   NA 139 09/19/2018   NA 145 (H) 08/05/2017   K  4.1 09/19/2018   CL 107 09/19/2018   CO2 23 09/19/2018   BUN 20 09/19/2018   BUN 22 08/05/2017   CREATININE 1.10 09/19/2018   PROT 7.0 07/18/2016   ALBUMIN 4.2 07/18/2016   BILITOT 1.0 07/18/2016   ALKPHOS 80 07/18/2016   AST 35 07/18/2016   ALT 64 (H) 07/18/2016  .   Total Time in preparing paper work, data evaluation and todays exam - 35 minutes  Huey Bienenstock M.D on 09/19/2018 at 11:47 AM  [facility]   Office  [phone]

## 2018-09-19 NOTE — Progress Notes (Signed)
Pt converted to NSR at 2100, EKG done and in file. Will continue to monitor patient.

## 2018-09-19 NOTE — Discharge Instructions (Signed)
Follow with Primary MD Patient, No Pcp Per in 7 days   Get CBC, CMP, 2 view Chest X ray checked  by Primary MD next visit.    Activity: As tolerated with Full fall precautions use walker/cane & assistance as needed   Disposition Home    Diet: Heart Healthy  , with feeding assistance and aspiration precautions.  For Heart failure patients - Check your Weight same time everyday, if you gain over 2 pounds, or you develop in leg swelling, experience more shortness of breath or chest pain, call your Primary MD immediately. Follow Cardiac Low Salt Diet and 1.5 lit/day fluid restriction.   On your next visit with your primary care physician please Get Medicines reviewed and adjusted.   Please request your Prim.MD to go over all Hospital Tests and Procedure/Radiological results at the follow up, please get all Hospital records sent to your Prim MD by signing hospital release before you go home.   If you experience worsening of your admission symptoms, develop shortness of breath, life threatening emergency, suicidal or homicidal thoughts you must seek medical attention immediately by calling 911 or calling your MD immediately  if symptoms less severe.  You Must read complete instructions/literature along with all the possible adverse reactions/side effects for all the Medicines you take and that have been prescribed to you. Take any new Medicines after you have completely understood and accpet all the possible adverse reactions/side effects.   Do not drive, operating heavy machinery, perform activities at heights, swimming or participation in water activities or provide baby sitting services if your were admitted for syncope or siezures until you have seen by Primary MD or a Neurologist and advised to do so again.  Do not drive when taking Pain medications.    Do not take more than prescribed Pain, Sleep and Anxiety Medications  Special Instructions: If you have smoked or chewed Tobacco  in  the last 2 yrs please stop smoking, stop any regular Alcohol  and or any Recreational drug use.  Wear Seat belts while driving.   Please note  You were cared for by a hospitalist during your hospital stay. If you have any questions about your discharge medications or the care you received while you were in the hospital after you are discharged, you can call the unit and asked to speak with the hospitalist on call if the hospitalist that took care of you is not available. Once you are discharged, your primary care physician will handle any further medical issues. Please note that NO REFILLS for any discharge medications will be authorized once you are discharged, as it is imperative that you return to your primary care physician (or establish a relationship with a primary care physician if you do not have one) for your aftercare needs so that they can reassess your need for medications and monitor your lab values.  Information on my medicine - XARELTO (Rivaroxaban)  This medication education was reviewed with me or my healthcare representative as part of my discharge preparation.   Why was Xarelto prescribed for you? Xarelto was prescribed for you to reduce the risk of a blood clot forming that can cause a stroke if you have a medical condition called atrial fibrillation (a type of irregular heartbeat).  What do you need to know about xarelto ? Take your Xarelto ONCE DAILY at the same time every day with your evening meal. If you have difficulty swallowing the tablet whole, you may crush it and mix in  applesauce just prior to taking your dose.  Take Xarelto exactly as prescribed by your doctor and DO NOT stop taking Xarelto without talking to the doctor who prescribed the medication.  Stopping without other stroke prevention medication to take the place of Xarelto may increase your risk of developing a clot that causes a stroke.  Refill your prescription before you run out.  After  discharge, you should have regular check-up appointments with your healthcare provider that is prescribing your Xarelto.  In the future your dose may need to be changed if your kidney function or weight changes by a significant amount.  What do you do if you miss a dose? If you are taking Xarelto ONCE DAILY and you miss a dose, take it as soon as you remember on the same day then continue your regularly scheduled once daily regimen the next day. Do not take two doses of Xarelto at the same time or on the same day.   Important Safety Information A possible side effect of Xarelto is bleeding. You should call your healthcare provider right away if you experience any of the following: ? Bleeding from an injury or your nose that does not stop. ? Unusual colored urine (red or dark brown) or unusual colored stools (red or black). ? Unusual bruising for unknown reasons. ? A serious fall or if you hit your head (even if there is no bleeding).  Some medicines may interact with Xarelto and might increase your risk of bleeding while on Xarelto. To help avoid this, consult your healthcare provider or pharmacist prior to using any new prescription or non-prescription medications, including herbals, vitamins, non-steroidal anti-inflammatory drugs (NSAIDs) and supplements.  This website has more information on Xarelto: VisitDestination.com.br.

## 2018-09-26 ENCOUNTER — Telehealth (HOSPITAL_COMMUNITY): Payer: Self-pay | Admitting: *Deleted

## 2018-09-26 DIAGNOSIS — I10 Essential (primary) hypertension: Secondary | ICD-10-CM | POA: Diagnosis not present

## 2018-09-26 DIAGNOSIS — I4891 Unspecified atrial fibrillation: Secondary | ICD-10-CM | POA: Diagnosis not present

## 2018-09-26 DIAGNOSIS — Z09 Encounter for follow-up examination after completed treatment for conditions other than malignant neoplasm: Secondary | ICD-10-CM | POA: Diagnosis not present

## 2018-09-26 NOTE — Telephone Encounter (Signed)
Patient called in stating he has noticed for the last 3 days he has noticed his HR in the 140s. He states he does not feel he is in AF - according to his wifes apple watch. He did have another URI this past week but feels much improved from that aspect. He is currently in [place] so he cannot come in for EKG here today - He has an appointment on Monday for follow up starting flecainide. Pt states his SBP is in the 130s. He currently takes coreg 6.25mg  bid - per renee ursey pa - increase to 1 and 1/2 tablets over weekend but have low threshold to present to ER if symptoms worsen. Pt asked if he could increase flecainide but instructed pt to keep at 100mg  BID until seen. Pt verbalized understanding.

## 2018-09-29 ENCOUNTER — Ambulatory Visit (HOSPITAL_COMMUNITY)
Admission: RE | Admit: 2018-09-29 | Discharge: 2018-09-29 | Disposition: A | Discharge status: Home / Self Care | Payer: BLUE CROSS/BLUE SHIELD | Source: Ambulatory Visit | Attending: Nurse Practitioner | Admitting: Nurse Practitioner

## 2018-09-29 VITALS — BP 152/112 | HR 132 | Ht 74.0 in | Wt 289.0 lb

## 2018-09-29 DIAGNOSIS — E669 Obesity, unspecified: Secondary | ICD-10-CM | POA: Diagnosis not present

## 2018-09-29 DIAGNOSIS — Z9889 Other specified postprocedural states: Secondary | ICD-10-CM | POA: Insufficient documentation

## 2018-09-29 DIAGNOSIS — I4819 Other persistent atrial fibrillation: Secondary | ICD-10-CM | POA: Diagnosis not present

## 2018-09-29 DIAGNOSIS — Z6837 Body mass index (BMI) 37.0-37.9, adult: Secondary | ICD-10-CM | POA: Insufficient documentation

## 2018-09-29 DIAGNOSIS — I10 Essential (primary) hypertension: Secondary | ICD-10-CM | POA: Insufficient documentation

## 2018-09-29 MED ORDER — DILTIAZEM HCL ER COATED BEADS 120 MG PO CP24: 120.0000 mg | ORAL_CAPSULE | Freq: Every day | ORAL | 3 refills | Status: DC

## 2018-09-29 MED ORDER — FLECAINIDE ACETATE 150 MG PO TABS: 150.0000 mg | ORAL_TABLET | Freq: Two times a day (BID) | ORAL | 3 refills | Status: DC

## 2018-09-29 NOTE — Patient Instructions (Signed)
Increase flecainide to 150mg  twice a day  Start Cardizem 120mg  once a day

## 2018-09-29 NOTE — Progress Notes (Signed)
Primary Care Physician: Patient, No Pcp Per Referring Physician: Dr. Joesphine Bare Russell Arnold is a 57 y.o. male with a h/o afib s/p ablation x 2, 08/2016/08/2017. When he was seen by Dr. Johney Frame in August, he was doing well in SR and Coreg was reduced to 12.5 mg bid for dizziness.   F/u afib clinic 12/16. Patient recently hospitalized for a GI illness and afib with rapid rates. He states that his GI symptoms have resolved but his afib has persisted with HR 130s-140s. He is fairly asymptomatic but has noted increased fatigue. Patient increased his coreg to 25 mg BID and his flecainide to 100 mg BID with 50 mg at lunchtime on his own with minimal affect on his rates.  Today, he denies symptoms of palpitations, chest pain, shortness of breath, orthopnea, PND, lower extremity edema, dizziness, presyncope, syncope, or neurologic sequela. The patient is tolerating medications without difficulties.  Past Medical History:  Diagnosis Date  . A-fib (HCC)   . Acute diastolic heart failure (HCC) 05/2016  . Family history of adverse reaction to anesthesia   . Hypertension    Past Surgical History:  Procedure Laterality Date  . ABLATION OF DYSRHYTHMIC FOCUS  09/04/2016  . APPENDECTOMY    . ATRIAL FIBRILLATION ABLATION N/A 08/22/2017   Procedure: ATRIAL FIBRILLATION ABLATION;  Surgeon: Hillis Range, MD;  Location: [facility] INVASIVE CV LAB;  Service: Cardiovascular;  Laterality: N/A;  . CARDIOVERSION N/A 06/14/2016   Procedure: CARDIOVERSION;  Surgeon: Peter M Swaziland, MD;  Location: [facility] ENDOSCOPY;  Service: Cardiovascular;  Laterality: N/A;  . CARDIOVERSION N/A 06/17/2016   Procedure: CARDIOVERSION;  Surgeon: Dolores Patty, MD;  Location: [facility] OR;  Service: Cardiovascular;  Laterality: N/A;  . ELECTROPHYSIOLOGIC STUDY N/A 09/04/2016   Procedure: Atrial Fibrillation Ablation;  Surgeon: Hillis Range, MD;  Location: [facility] INVASIVE CV LAB;  Service: Cardiovascular;  Laterality: N/A;  . TEE WITHOUT  CARDIOVERSION N/A 09/03/2016   Procedure: TRANSESOPHAGEAL ECHOCARDIOGRAM (TEE);  Surgeon: Thurmon Fair, MD;  Location: [facility] ENDOSCOPY;  Service: Cardiovascular;  Laterality: N/A;  . TEE WITHOUT CARDIOVERSION N/A 08/21/2017   Procedure: TRANSESOPHAGEAL ECHOCARDIOGRAM (TEE);  Surgeon: Wendall Stade, MD;  Location: [facility] ENDOSCOPY;  Service: Cardiovascular;  Laterality: N/A;    Current Outpatient Medications  Medication Sig Dispense Refill  . Arginine 1000 MG TABS Take 1 tablet by mouth daily.    . carvedilol (COREG) 25 MG tablet Take 25 mg by mouth 2 (two) times daily with a meal.    . cholecalciferol (VITAMIN D) 1000 units tablet Take 1,000 Units by mouth daily.    . flecainide (TAMBOCOR) 150 MG tablet Take 1 tablet (150 mg total) by mouth every 12 (twelve) hours. 60 tablet 3  . losartan (COZAAR) 50 MG tablet Take 1 tablet (50 mg total) by mouth daily. 90 tablet 3  . Magnesium 400 MG CAPS Take by mouth.    . Potassium 99 MG TABS Take 1 tablet by mouth daily.    . rivaroxaban (XARELTO) 20 MG TABS tablet Take 1 tablet (20 mg total) by mouth daily with supper. 30 tablet 3  . sildenafil (VIAGRA) 100 MG tablet Take 1 tablet (100 mg total) by mouth daily as needed for erectile dysfunction. 10 tablet 1  . diltiazem (CARDIZEM CD) 120 MG 24 hr capsule Take 1 capsule (120 mg total) by mouth daily. 30 capsule 3   No current facility-administered medications for this encounter.     Allergies  Allergen Reactions  . Amoxicillin Hives  Hives - noted "red splotches" - also ineffective per pt Has patient had a PCN reaction causing immediate rash, facial/tongue/throat swelling, SOB or lightheadedness with hypotension: No Has patient had a PCN reaction causing severe rash involving mucus membranes or skin necrosis: No Has patient had a PCN reaction that required hospitalization:No Has patient had a PCN reaction occurring within the last 10 years:Yes If all of the above answers are "NO", then may proceed  with Cephalosporin use.      Social History   Socioeconomic History  . Marital status: Married    Spouse name: Not on file  . Number of children: Not on file  . Years of education: Not on file  . Highest education level: Not on file  Occupational History  . Occupation: Engineer, technical sales Needs  . Financial resource strain: Not on file  . Food insecurity:    Worry: Not on file    Inability: Not on file  . Transportation needs:    Medical: Not on file    Non-medical: Not on file  Tobacco Use  . Smoking status: Never Smoker  . Smokeless tobacco: Former Neurosurgeon    Types: Chew  Substance and Sexual Activity  . Alcohol use: No  . Drug use: No  . Sexual activity: Not on file  Lifestyle  . Physical activity:    Days per week: Not on file    Minutes per session: Not on file  . Stress: Not on file  Relationships  . Social connections:    Talks on phone: Not on file    Gets together: Not on file    Attends religious service: Not on file    Active member of club or organization: Not on file    Attends meetings of clubs or organizations: Not on file    Relationship status: Not on file  . Intimate partner violence:    Fear of current or ex partner: Not on file    Emotionally abused: Not on file    Physically abused: Not on file    Forced sexual activity: Not on file  Other Topics Concern  . Not on file  Social History Narrative  . Not on file    Family History  Problem Relation Age of Onset  . Heart failure Mother     ROS- All systems are reviewed and negative except as per the HPI above  Physical Exam: Vitals:   09/29/18 1043  BP: (!) 152/112  Pulse: (!) 132  Weight: 131.1 kg  Height: 6\' 2"  (1.88 m)   Wt Readings from Last 3 Encounters:  09/29/18 131.1 kg  09/19/18 133.5 kg  09/15/18 136.1 kg    Labs: Lab Results  Component Value Date   NA 139 09/19/2018   K 4.1 09/19/2018   CL 107 09/19/2018   CO2 23 09/19/2018   GLUCOSE 116 (H) 09/19/2018    BUN 20 09/19/2018   CREATININE 1.10 09/19/2018   CALCIUM 8.4 (L) 09/19/2018   MG 2.3 09/18/2018   No results found for: INR Lab Results  Component Value Date   CHOL 124 06/14/2016   HDL 54 06/14/2016   LDLCALC 60 06/14/2016   TRIG 48 06/14/2016     GEN- The patient is well appearing, alert and oriented x 3 today.   Head- normocephalic, atraumatic Eyes-  Sclera clear, conjunctiva pink Ears- hearing intact Oropharynx- clear Neck- supple, no JVP Lymph- no cervical lymphadenopathy Lungs- Clear to ausculation bilaterally, normal work of breathing Heart- rapid irregularly  irregular rate and rhythm, no murmurs, rubs or gallops,  Extremities- no clubbing, cyanosis, or edema MS- no significant deformity or atrophy Skin- no rash or lesion Psych- euthymic mood, full affect Neuro- strength and sensation are intact  EKG- atrial fibrillation HR 132, LAD, QRS 112, QTc 527 (difficult to see with rapid afib)  Assessment and Plan: 1. Persistent atrial fibrillation In afib with RVR today, he is fairly asymptomatic with maybe a little more fatigue. We discussed therapeutic options of increasing flecainide vs starting dofetilide, he prefers to increase his flecainide at this time. Patient increased his Coreg from 6.25 to 25 mg BID. Will increase flecainide to 150 mg BID. Return for ECG check 1 week. Will start diltiazem 120 mg daily Continue xarelto at 20 mg daily   2. HTN BP elevated today.  Will continue Coreg 25 mg BID Continue losartan 50 mg daily Start diltiazem 120 mg daily  3. Obesity Body mass index is 37.11 kg/m. Lifestyle modifications encourage  Follow up in afib clinic in one week. Follow up with Dr Johney Frame in 4 weeks.  Jorja Loa PA-C Afib Clinic [facility] [address] [place], [place] [address] [phone]

## 2018-10-06 ENCOUNTER — Ambulatory Visit (HOSPITAL_COMMUNITY)
Admission: RE | Admit: 2018-10-06 | Discharge: 2018-10-06 | Disposition: A | Discharge status: Home / Self Care | Payer: BLUE CROSS/BLUE SHIELD | Source: Ambulatory Visit | Attending: Nurse Practitioner | Admitting: Nurse Practitioner

## 2018-10-06 ENCOUNTER — Encounter (HOSPITAL_COMMUNITY): Payer: Self-pay | Admitting: Nurse Practitioner

## 2018-10-06 VITALS — BP 128/86 | HR 73 | Ht 74.0 in | Wt 297.0 lb

## 2018-10-06 DIAGNOSIS — I4819 Other persistent atrial fibrillation: Secondary | ICD-10-CM

## 2018-10-06 DIAGNOSIS — I5031 Acute diastolic (congestive) heart failure: Secondary | ICD-10-CM | POA: Insufficient documentation

## 2018-10-06 DIAGNOSIS — Z880 Allergy status to penicillin: Secondary | ICD-10-CM | POA: Diagnosis not present

## 2018-10-06 DIAGNOSIS — E669 Obesity, unspecified: Secondary | ICD-10-CM | POA: Insufficient documentation

## 2018-10-06 DIAGNOSIS — Z6838 Body mass index (BMI) 38.0-38.9, adult: Secondary | ICD-10-CM | POA: Insufficient documentation

## 2018-10-06 DIAGNOSIS — Z79899 Other long term (current) drug therapy: Secondary | ICD-10-CM | POA: Insufficient documentation

## 2018-10-06 DIAGNOSIS — Z8249 Family history of ischemic heart disease and other diseases of the circulatory system: Secondary | ICD-10-CM | POA: Diagnosis not present

## 2018-10-06 DIAGNOSIS — I110 Hypertensive heart disease with heart failure: Secondary | ICD-10-CM | POA: Insufficient documentation

## 2018-10-06 DIAGNOSIS — Z9889 Other specified postprocedural states: Secondary | ICD-10-CM | POA: Diagnosis not present

## 2018-10-06 NOTE — Progress Notes (Addendum)
Primary Care Physician: Patient, No Pcp Per Referring Physician: Dr. Joesphine Bare Zarren Penfold is a 57 y.o. male with a h/o afib s/p ablation x 2, 08/2016/08/2017. When he was seen by Dr. Johney Frame in August, he was doing well in SR and Coreg was reduced to 12.5 mg bid for dizziness.   F/u afib clinic 12/16. Patient recently hospitalized for a GI illness and afib with rapid rates. He states that his GI symptoms have resolved but his afib has persisted with HR 130s-140s. He is fairly asymptomatic but has noted increased fatigue. Patient increased his coreg to 25 mg BID and his flecainide to 100 mg BID with 50 mg at lunchtime on his own with minimal affect on his rates.  F/u in afib clinic, 12/23. He had his flecainide increased to 150 mg bid on last visit, as well as addition of Cardizem 120 mg daily as well as coreg 25 mg bid. He converted about 2 days later. He is in SR today and feels improved.   Today, he denies symptoms of palpitations, chest pain, shortness of breath, orthopnea, PND, lower extremity edema, dizziness, presyncope, syncope, or neurologic sequela. The patient is tolerating medications without difficulties.  Past Medical History:  Diagnosis Date  . A-fib (HCC)   . Acute diastolic heart failure (HCC) 05/2016  . Family history of adverse reaction to anesthesia   . Hypertension    Past Surgical History:  Procedure Laterality Date  . ABLATION OF DYSRHYTHMIC FOCUS  09/04/2016  . APPENDECTOMY    . ATRIAL FIBRILLATION ABLATION N/A 08/22/2017   Procedure: ATRIAL FIBRILLATION ABLATION;  Surgeon: Hillis Range, MD;  Location: [facility] INVASIVE CV LAB;  Service: Cardiovascular;  Laterality: N/A;  . CARDIOVERSION N/A 06/14/2016   Procedure: CARDIOVERSION;  Surgeon: Peter M Swaziland, MD;  Location: [facility] ENDOSCOPY;  Service: Cardiovascular;  Laterality: N/A;  . CARDIOVERSION N/A 06/17/2016   Procedure: CARDIOVERSION;  Surgeon: Dolores Patty, MD;  Location: [facility] OR;  Service:  Cardiovascular;  Laterality: N/A;  . ELECTROPHYSIOLOGIC STUDY N/A 09/04/2016   Procedure: Atrial Fibrillation Ablation;  Surgeon: Hillis Range, MD;  Location: [facility] INVASIVE CV LAB;  Service: Cardiovascular;  Laterality: N/A;  . TEE WITHOUT CARDIOVERSION N/A 09/03/2016   Procedure: TRANSESOPHAGEAL ECHOCARDIOGRAM (TEE);  Surgeon: Thurmon Fair, MD;  Location: [facility] ENDOSCOPY;  Service: Cardiovascular;  Laterality: N/A;  . TEE WITHOUT CARDIOVERSION N/A 08/21/2017   Procedure: TRANSESOPHAGEAL ECHOCARDIOGRAM (TEE);  Surgeon: Wendall Stade, MD;  Location: [facility] ENDOSCOPY;  Service: Cardiovascular;  Laterality: N/A;    Current Outpatient Medications  Medication Sig Dispense Refill  . carvedilol (COREG) 25 MG tablet Take 25 mg by mouth 2 (two) times daily with a meal.    . cholecalciferol (VITAMIN D) 1000 units tablet Take 1,000 Units by mouth daily.    Marland Kitchen diltiazem (CARDIZEM CD) 120 MG 24 hr capsule Take 1 capsule (120 mg total) by mouth daily. 30 capsule 3  . flecainide (TAMBOCOR) 150 MG tablet Take 1 tablet (150 mg total) by mouth every 12 (twelve) hours. 60 tablet 3  . losartan (COZAAR) 50 MG tablet Take 1 tablet (50 mg total) by mouth daily. 90 tablet 3  . rivaroxaban (XARELTO) 20 MG TABS tablet Take 1 tablet (20 mg total) by mouth daily with supper. 30 tablet 3  . sildenafil (VIAGRA) 100 MG tablet Take 1 tablet (100 mg total) by mouth daily as needed for erectile dysfunction. 10 tablet 1   No current facility-administered medications for this encounter.  Allergies  Allergen Reactions  . Amoxicillin Hives    Hives - noted "red splotches" - also ineffective per pt Has patient had a PCN reaction causing immediate rash, facial/tongue/throat swelling, SOB or lightheadedness with hypotension: No Has patient had a PCN reaction causing severe rash involving mucus membranes or skin necrosis: No Has patient had a PCN reaction that required hospitalization:No Has patient had a PCN reaction occurring  within the last 10 years:Yes If all of the above answers are "NO", then may proceed with Cephalosporin use.      Social History   Socioeconomic History  . Marital status: Married    Spouse name: Not on file  . Number of children: Not on file  . Years of education: Not on file  . Highest education level: Not on file  Occupational History  . Occupation: Engineer, technical sales Needs  . Financial resource strain: Not on file  . Food insecurity:    Worry: Not on file    Inability: Not on file  . Transportation needs:    Medical: Not on file    Non-medical: Not on file  Tobacco Use  . Smoking status: Never Smoker  . Smokeless tobacco: Former Neurosurgeon    Types: Chew  Substance and Sexual Activity  . Alcohol use: No  . Drug use: No  . Sexual activity: Not on file  Lifestyle  . Physical activity:    Days per week: Not on file    Minutes per session: Not on file  . Stress: Not on file  Relationships  . Social connections:    Talks on phone: Not on file    Gets together: Not on file    Attends religious service: Not on file    Active member of club or organization: Not on file    Attends meetings of clubs or organizations: Not on file    Relationship status: Not on file  . Intimate partner violence:    Fear of current or ex partner: Not on file    Emotionally abused: Not on file    Physically abused: Not on file    Forced sexual activity: Not on file  Other Topics Concern  . Not on file  Social History Narrative  . Not on file    Family History  Problem Relation Age of Onset  . Heart failure Mother     ROS- All systems are reviewed and negative except as per the HPI above  Physical Exam: Vitals:   10/06/18 1437  BP: 128/86  Pulse: 73  SpO2: 99%  Weight: 134.7 kg  Height: 6\' 2"  (1.88 m)   Wt Readings from Last 3 Encounters:  10/06/18 134.7 kg  09/29/18 131.1 kg  09/19/18 133.5 kg    Labs: Lab Results  Component Value Date   NA 139 09/19/2018   K 4.1  09/19/2018   CL 107 09/19/2018   CO2 23 09/19/2018   GLUCOSE 116 (H) 09/19/2018   BUN 20 09/19/2018   CREATININE 1.10 09/19/2018   CALCIUM 8.4 (L) 09/19/2018   MG 2.3 09/18/2018   No results found for: INR Lab Results  Component Value Date   CHOL 124 06/14/2016   HDL 54 06/14/2016   LDLCALC 60 06/14/2016   TRIG 48 06/14/2016     GEN- The patient is well appearing, alert and oriented x 3 today.   Head- normocephalic, atraumatic Eyes-  Sclera clear, conjunctiva pink Ears- hearing intact Oropharynx- clear Neck- supple, no JVP Lymph- no cervical lymphadenopathy  Lungs- Clear to ausculation bilaterally, normal work of breathing Heart-regular rate and rhythm, no murmurs, rubs or gallops,  Extremities- no clubbing, cyanosis, or edema MS- no significant deformity or atrophy Skin- no rash or lesion Psych- euthymic mood, full affect Neuro- strength and sensation are intact  EKG-  SR at 70 bpm, PR int 206 ms, qrs int 110 ms, qtc 466 ms  Assessment and Plan: 1. Persistent atrial fibrillation In SR today Continue on flecainide 150 mg bid, Cardizem 120 mg qd and carvedilol 25 mg bid  Continue xarelto at 20 mg daily   2. HTN Stable today  3. Obesity Body mass index is 38.13 kg/m. Lifestyle modifications encourage  Follow up with Dr Johney FrameAllred  1/17  Jorja Loaicky Fenton PA-C Afib Clinic [facility] [address] [place], [place] [id number] [phone]

## 2018-10-31 ENCOUNTER — Ambulatory Visit: Payer: BLUE CROSS/BLUE SHIELD | Admitting: Internal Medicine

## 2018-12-02 ENCOUNTER — Telehealth (HOSPITAL_COMMUNITY): Payer: Self-pay | Admitting: *Deleted

## 2018-12-02 NOTE — Telephone Encounter (Signed)
Patient called in stating he has been out of rhythm for about a week. HR in the 115-120 range. He did take an extra flecainide earlier in the week but it did not convert him. Instructed patient he should not take extra flecainide with a daily dose of 150mg  BID. Pt will try taking an extra 120mg  of cardizem tonight and call tomorrow with bp/hr reading. Pt in agreement.

## 2018-12-03 ENCOUNTER — Ambulatory Visit (HOSPITAL_COMMUNITY)
Admission: RE | Admit: 2018-12-03 | Discharge: 2018-12-03 | Disposition: A | Discharge status: Home / Self Care | Payer: BLUE CROSS/BLUE SHIELD | Source: Ambulatory Visit | Attending: Nurse Practitioner | Admitting: Nurse Practitioner

## 2018-12-03 ENCOUNTER — Other Ambulatory Visit (HOSPITAL_COMMUNITY): Payer: Self-pay | Admitting: Nurse Practitioner

## 2018-12-03 ENCOUNTER — Encounter (HOSPITAL_COMMUNITY): Payer: Self-pay | Admitting: Nurse Practitioner

## 2018-12-03 VITALS — BP 132/94 | HR 131 | Ht 74.0 in | Wt 290.0 lb

## 2018-12-03 DIAGNOSIS — I4510 Unspecified right bundle-branch block: Secondary | ICD-10-CM | POA: Insufficient documentation

## 2018-12-03 DIAGNOSIS — Z79899 Other long term (current) drug therapy: Secondary | ICD-10-CM | POA: Insufficient documentation

## 2018-12-03 DIAGNOSIS — I110 Hypertensive heart disease with heart failure: Secondary | ICD-10-CM | POA: Insufficient documentation

## 2018-12-03 DIAGNOSIS — Z880 Allergy status to penicillin: Secondary | ICD-10-CM | POA: Diagnosis not present

## 2018-12-03 DIAGNOSIS — I4892 Unspecified atrial flutter: Secondary | ICD-10-CM | POA: Diagnosis not present

## 2018-12-03 DIAGNOSIS — I5031 Acute diastolic (congestive) heart failure: Secondary | ICD-10-CM | POA: Insufficient documentation

## 2018-12-03 DIAGNOSIS — I471 Supraventricular tachycardia: Secondary | ICD-10-CM | POA: Insufficient documentation

## 2018-12-03 DIAGNOSIS — R9431 Abnormal electrocardiogram [ECG] [EKG]: Secondary | ICD-10-CM | POA: Diagnosis not present

## 2018-12-03 DIAGNOSIS — Z8249 Family history of ischemic heart disease and other diseases of the circulatory system: Secondary | ICD-10-CM | POA: Insufficient documentation

## 2018-12-03 DIAGNOSIS — I4819 Other persistent atrial fibrillation: Secondary | ICD-10-CM | POA: Insufficient documentation

## 2018-12-03 DIAGNOSIS — Z7901 Long term (current) use of anticoagulants: Secondary | ICD-10-CM | POA: Diagnosis not present

## 2018-12-03 MED ORDER — DILTIAZEM HCL ER COATED BEADS 120 MG PO CP24: 120.0000 mg | ORAL_CAPSULE | Freq: Two times a day (BID) | ORAL | 3 refills | Status: DC

## 2018-12-03 NOTE — Telephone Encounter (Signed)
Pt continues in af. Hr in the 115 range. Will bring in for appt.

## 2018-12-03 NOTE — Patient Instructions (Signed)
Stop flecainide on Friday evening  Continue cardizem 120mg  twice a day

## 2018-12-03 NOTE — Progress Notes (Signed)
Primary Care Physician: Patient, No Pcp Per Referring Physician:Dr. Allred   Russell Arnold is a 57 y.o. male with a h/o persistent afib/flutter s/p ablation x 2 and currently on flecainide 150 mg bid now in afib clinic for afib starting one week ago. He usually  had high v rates when out of rhythm and  at the beginning of his afib history, had [facility]. He is tolerating well.  Today, he denies symptoms of palpitations, chest pain, shortness of breath, orthopnea, PND, lower extremity edema, dizziness, presyncope, syncope, or neurologic sequela. The patient is tolerating medications without difficulties and is otherwise without complaint today.   Past Medical History:  Diagnosis Date  . A-fib (HCC)   . Acute diastolic heart failure (HCC) 05/2016  . Family history of adverse reaction to anesthesia   . Hypertension    Past Surgical History:  Procedure Laterality Date  . ABLATION OF DYSRHYTHMIC FOCUS  09/04/2016  . APPENDECTOMY    . ATRIAL FIBRILLATION ABLATION N/A 08/22/2017   Procedure: ATRIAL FIBRILLATION ABLATION;  Surgeon: Hillis Range, MD;  Location: [facility] INVASIVE CV LAB;  Service: Cardiovascular;  Laterality: N/A;  . CARDIOVERSION N/A 06/14/2016   Procedure: CARDIOVERSION;  Surgeon: Peter M Swaziland, MD;  Location: [facility] ENDOSCOPY;  Service: Cardiovascular;  Laterality: N/A;  . CARDIOVERSION N/A 06/17/2016   Procedure: CARDIOVERSION;  Surgeon: Dolores Patty, MD;  Location: [facility] OR;  Service: Cardiovascular;  Laterality: N/A;  . ELECTROPHYSIOLOGIC STUDY N/A 09/04/2016   Procedure: Atrial Fibrillation Ablation;  Surgeon: Hillis Range, MD;  Location: [facility] INVASIVE CV LAB;  Service: Cardiovascular;  Laterality: N/A;  . TEE WITHOUT CARDIOVERSION N/A 09/03/2016   Procedure: TRANSESOPHAGEAL ECHOCARDIOGRAM (TEE);  Surgeon: Thurmon Fair, MD;  Location: [facility] ENDOSCOPY;  Service: Cardiovascular;  Laterality: N/A;  . TEE WITHOUT CARDIOVERSION N/A 08/21/2017   Procedure: TRANSESOPHAGEAL  ECHOCARDIOGRAM (TEE);  Surgeon: Wendall Stade, MD;  Location: [facility] ENDOSCOPY;  Service: Cardiovascular;  Laterality: N/A;    Current Outpatient Medications  Medication Sig Dispense Refill  . carvedilol (COREG) 25 MG tablet TAKE 1 TABLET BY MOUTH TWICE DAILY WITH A MEAL 60 tablet 6  . cholecalciferol (VITAMIN D) 1000 units tablet Take 1,000 Units by mouth daily.    Marland Kitchen diltiazem (CARDIZEM CD) 120 MG 24 hr capsule Take 1 capsule (120 mg total) by mouth 2 (two) times daily. 30 capsule 3  . flecainide (TAMBOCOR) 150 MG tablet Take 1 tablet (150 mg total) by mouth every 12 (twelve) hours. 60 tablet 3  . losartan (COZAAR) 50 MG tablet Take 1 tablet (50 mg total) by mouth daily. 90 tablet 3  . rivaroxaban (XARELTO) 20 MG TABS tablet Take 1 tablet (20 mg total) by mouth daily with supper. 30 tablet 3  . sildenafil (VIAGRA) 100 MG tablet Take 1 tablet (100 mg total) by mouth daily as needed for erectile dysfunction. 10 tablet 1   No current facility-administered medications for this encounter.     Allergies  Allergen Reactions  . Amoxicillin Hives    Hives - noted "red splotches" - also ineffective per pt Has patient had a PCN reaction causing immediate rash, facial/tongue/throat swelling, SOB or lightheadedness with hypotension: No Has patient had a PCN reaction causing severe rash involving mucus membranes or skin necrosis: No Has patient had a PCN reaction that required hospitalization:No Has patient had a PCN reaction occurring within the last 10 years:Yes If all of the above answers are "NO", then may proceed with Cephalosporin use.      Social  History   Socioeconomic History  . Marital status: Married    Spouse name: Not on file  . Number of children: Not on file  . Years of education: Not on file  . Highest education level: Not on file  Occupational History  . Occupation: Engineer, technical sales Needs  . Financial resource strain: Not on file  . Food insecurity:    Worry: Not on  file    Inability: Not on file  . Transportation needs:    Medical: Not on file    Non-medical: Not on file  Tobacco Use  . Smoking status: Never Smoker  . Smokeless tobacco: Former Neurosurgeon    Types: Chew  Substance and Sexual Activity  . Alcohol use: No  . Drug use: No  . Sexual activity: Not on file  Lifestyle  . Physical activity:    Days per week: Not on file    Minutes per session: Not on file  . Stress: Not on file  Relationships  . Social connections:    Talks on phone: Not on file    Gets together: Not on file    Attends religious service: Not on file    Active member of club or organization: Not on file    Attends meetings of clubs or organizations: Not on file    Relationship status: Not on file  . Intimate partner violence:    Fear of current or ex partner: Not on file    Emotionally abused: Not on file    Physically abused: Not on file    Forced sexual activity: Not on file  Other Topics Concern  . Not on file  Social History Narrative  . Not on file    Family History  Problem Relation Age of Onset  . Heart failure Mother     ROS- All systems are reviewed and negative except as per the HPI above  Physical Exam: Vitals:   12/03/18 1523  BP: (!) 132/94  Pulse: (!) 131  Weight: 131.5 kg  Height: 6\' 2"  (1.88 m)   Wt Readings from Last 3 Encounters:  12/03/18 131.5 kg  10/06/18 134.7 kg  09/29/18 131.1 kg    Labs: Lab Results  Component Value Date   NA 139 09/19/2018   K 4.1 09/19/2018   CL 107 09/19/2018   CO2 23 09/19/2018   GLUCOSE 116 (H) 09/19/2018   BUN 20 09/19/2018   CREATININE 1.10 09/19/2018   CALCIUM 8.4 (L) 09/19/2018   MG 2.3 09/18/2018   No results found for: INR Lab Results  Component Value Date   CHOL 124 06/14/2016   HDL 54 06/14/2016   LDLCALC 60 06/14/2016   TRIG 48 06/14/2016     GEN- The patient is well appearing, alert and oriented x 3 today.   Head- normocephalic, atraumatic Eyes-  Sclera clear, conjunctiva  pink Ears- hearing intact Oropharynx- clear Neck- supple, no JVP Lymph- no cervical lymphadenopathy Lungs- Clear to ausculation bilaterally, normal work of breathing Heart- Rapid egular rate and rhythm, no murmurs, rubs or gallops, PMI not laterally displaced GI- soft, NT, ND, + BS Extremities- no clubbing, cyanosis, or edema MS- no significant deformity or atrophy Skin- no rash or lesion Psych- euthymic mood, full affect Neuro- strength and sensation are intact  EKG-atrail flutter at 130 bpm, IRBBB, with qtc prolonged at 552 ms, EKG in SR runs around 420-  440 ms    Assessment and Plan: 1. Persistent afib/flutter Discussed options with pt He typically does  not have successful cardioversion so he really does not want to go this route I discussed with Dr. Johney Frame if he had a preference for pt to have a 3rd ablation or hospitalization for Tikosyn and he preferred Tikosyn Will plan on hospitalization for 2/25 He will stop flecainide Friday He will increase Cardizem 120 mg to bid He has to pay for the first $6000 out of pocket with his current insurance plan and is using good RX for his meds now and plans to use this for Tikosyn cost Meds to be screened by PharmD No missed xarelto doses for the last 3 weeks Last K+/mag were good levels for tikosyn admit  Lupita Leash C. [name] Afib Clinic [facility] [address] [place], [place] [address] [phone]

## 2018-12-04 ENCOUNTER — Telehealth: Payer: Self-pay | Admitting: Pharmacist

## 2018-12-04 NOTE — Telephone Encounter (Signed)
Medication list reviewed in anticipation of upcoming Tikosyn initiation. Patient is planning to stop flecainide 3 days prior to Tikosyn initiation. He does not take any other QTc prolonging or contraindicated medications.  Patient is anticoagulated on Xarelto 20mg  daily on the appropriate dose. Please ensure that patient has not missed any anticoagulation doses in the 3 weeks prior to Tikosyn initiation.   Patient will need to be counseled to avoid use of Benadryl while on Tikosyn and in the 2-3 days prior to Tikosyn initiation.

## 2018-12-08 ENCOUNTER — Telehealth (HOSPITAL_COMMUNITY): Payer: Self-pay | Admitting: *Deleted

## 2018-12-08 ENCOUNTER — Other Ambulatory Visit (HOSPITAL_COMMUNITY): Payer: Self-pay | Admitting: *Deleted

## 2018-12-08 MED ORDER — DILTIAZEM HCL ER COATED BEADS 120 MG PO CP24: 120.0000 mg | ORAL_CAPSULE | Freq: Two times a day (BID) | ORAL | 6 refills | Status: DC

## 2018-12-08 NOTE — Telephone Encounter (Signed)
Patient called to cancel tikosyn admission. States he stopped flecanide Thursday PM and went back into normal rhythm in the 70s on Friday. He would like to stay on increased cardizem 120mg  BID and off flecanide since he has been in rhythm on this regimen.He would like to hold off on tikosyn for now. Pt will call if change in status.

## 2018-12-09 ENCOUNTER — Ambulatory Visit (HOSPITAL_COMMUNITY): Payer: BLUE CROSS/BLUE SHIELD | Admitting: Nurse Practitioner

## 2018-12-09 ENCOUNTER — Inpatient Hospital Stay
Admission: RE | Admit: 2018-12-09 | Payer: BLUE CROSS/BLUE SHIELD | Source: Ambulatory Visit | Admitting: Internal Medicine

## 2018-12-15 ENCOUNTER — Ambulatory Visit: Payer: BLUE CROSS/BLUE SHIELD | Admitting: Internal Medicine

## 2018-12-31 ENCOUNTER — Other Ambulatory Visit (HOSPITAL_COMMUNITY): Payer: Self-pay | Admitting: *Deleted

## 2018-12-31 MED ORDER — LOSARTAN POTASSIUM 50 MG PO TABS: 50.0000 mg | ORAL_TABLET | Freq: Every day | ORAL | 3 refills | Status: DC

## 2019-02-05 ENCOUNTER — Telehealth: Payer: Self-pay | Admitting: Internal Medicine

## 2019-02-05 NOTE — Telephone Encounter (Signed)
New message    Called patient about past due recall, pt has appt in June. Offered to move appt to next week 04.27 or 04.29.20. Pt states that he is filling fine and would like to wait til he can come in. He said he knows to call if he has any issues.

## 2019-03-25 ENCOUNTER — Telehealth: Payer: Self-pay

## 2019-03-27 NOTE — Telephone Encounter (Signed)
Spoke with pt regarding appt on 03/30/19. Pt stated he would rather wait until he can come in office. Pt stated he is feeling fine and would like to reschedule appt.

## 2019-03-30 ENCOUNTER — Telehealth: Payer: BLUE CROSS/BLUE SHIELD | Admitting: Internal Medicine

## 2019-04-28 ENCOUNTER — Other Ambulatory Visit: Payer: Self-pay | Admitting: Internal Medicine

## 2019-04-28 NOTE — Telephone Encounter (Signed)
Prescription refill request for Xarelto received.   Last office visit: Russell Arnold ( 12-03-2018) Weight: 288 lbs ( 06-13-2018) Age: 58 y.o. Scr:  1.10 ( 09-19-2018) CrCl: 137 ml/min  Prescription refill sent.

## 2019-07-02 ENCOUNTER — Other Ambulatory Visit (HOSPITAL_COMMUNITY): Payer: Self-pay | Admitting: Nurse Practitioner

## 2019-07-02 MED ORDER — CARVEDILOL 25 MG PO TABS: ORAL_TABLET | ORAL | 6 refills | Status: DC

## 2019-09-09 ENCOUNTER — Other Ambulatory Visit: Payer: Self-pay

## 2019-09-24 ENCOUNTER — Ambulatory Visit: Payer: BC Managed Care – PPO | Admitting: Internal Medicine

## 2019-10-19 ENCOUNTER — Telehealth: Payer: Self-pay | Admitting: Internal Medicine

## 2019-10-19 ENCOUNTER — Telehealth: Payer: Self-pay

## 2019-10-19 NOTE — Telephone Encounter (Signed)
New Message  Patient is calling in to see if it is possible to have his office visit scheduled for 10/26/2019 at 11:00 am with Dr. Johney Frame changed to a virtual visit instead. Please give patient a call back to discuss.

## 2019-10-21 ENCOUNTER — Encounter: Payer: Self-pay | Admitting: Internal Medicine

## 2019-10-21 ENCOUNTER — Telehealth (INDEPENDENT_AMBULATORY_CARE_PROVIDER_SITE_OTHER): Payer: Managed Care, Other (non HMO) | Admitting: Internal Medicine

## 2019-10-21 VITALS — BP 130/88 | HR 72 | Ht 74.0 in | Wt 286.0 lb

## 2019-10-21 DIAGNOSIS — I4819 Other persistent atrial fibrillation: Secondary | ICD-10-CM

## 2019-10-21 DIAGNOSIS — I10 Essential (primary) hypertension: Secondary | ICD-10-CM

## 2019-10-21 NOTE — Progress Notes (Signed)
Electrophysiology TeleHealth Note   Due to national recommendations of social distancing due to COVID 19, an audio/video telehealth visit is felt to be most appropriate for this patient at this time.  See MyChart message from today for the patient's consent to telehealth for [facility].   Date:  10/21/2019   ID:  Russell Arnold, DOB 1961-09-04, MRN [id number]  Location: patient's home  Provider location:  [facility]  Evaluation Performed: Follow-up visit  PCP:  Patient, No Pcp Per   Electrophysiologist:  Dr Rayann Heman  Chief Complaint:  palpitations  History of Present Illness:    Russell Arnold is a 59 y.o. male who presents via telehealth conferencing today.  Since last being seen in our clinic, the patient reports doing very well.  He now lives in [place].  He is happy with his current health state.  He has occasional afib, typically lasting minutes to several hours.  Today, he denies symptoms of exertional chest pain, shortness of breath,  lower extremity edema, dizziness, presyncope, or syncope.  The patient is otherwise without complaint today.  The patient denies symptoms of fevers, chills, cough, or new SOB worrisome for COVID 19.  Past Medical History:  Diagnosis Date  . Acute diastolic heart failure ([place]) 05/2016  . Family history of adverse reaction to anesthesia   . Hypertension   . Persistent atrial fibrillation [facility])     Past Surgical History:  Procedure Laterality Date  . ABLATION OF DYSRHYTHMIC FOCUS  09/04/2016  . APPENDECTOMY    . ATRIAL FIBRILLATION ABLATION N/A 08/22/2017   Procedure: ATRIAL FIBRILLATION ABLATION;  Surgeon: Thompson Grayer, MD;  Location: [place] CV LAB;  Service: Cardiovascular;  Laterality: N/A;  . CARDIOVERSION N/A 06/14/2016   Procedure: CARDIOVERSION;  Surgeon: Peter M Martinique, MD;  Location: [facility] ENDOSCOPY;  Service: Cardiovascular;  Laterality: N/A;  . CARDIOVERSION N/A 06/17/2016   Procedure:  CARDIOVERSION;  Surgeon: Jolaine Artist, MD;  Location: [place];  Service: Cardiovascular;  Laterality: N/A;  . ELECTROPHYSIOLOGIC STUDY N/A 09/04/2016   Procedure: Atrial Fibrillation Ablation;  Surgeon: Thompson Grayer, MD;  Location: [place] CV LAB;  Service: Cardiovascular;  Laterality: N/A;  . TEE WITHOUT CARDIOVERSION N/A 09/03/2016   Procedure: TRANSESOPHAGEAL ECHOCARDIOGRAM (TEE);  Surgeon: Sanda Klein, MD;  Location: [facility] ENDOSCOPY;  Service: Cardiovascular;  Laterality: N/A;  . TEE WITHOUT CARDIOVERSION N/A 08/21/2017   Procedure: TRANSESOPHAGEAL ECHOCARDIOGRAM (TEE);  Surgeon: Josue Hector, MD;  Location: [facility] ENDOSCOPY;  Service: Cardiovascular;  Laterality: N/A;    Current Outpatient Medications  Medication Sig Dispense Refill  . carvedilol (COREG) 25 MG tablet TAKE 1 TABLET BY MOUTH TWICE DAILY WITH A MEAL 60 tablet 6  . cholecalciferol (VITAMIN D) 1000 units tablet Take 1,000 Units by mouth daily.    Marland Kitchen diltiazem (CARDIZEM CD) 120 MG 24 hr capsule TAKE ONE CAPSULE BY MOUTH TWICE A DAY 60 capsule 5  . losartan (COZAAR) 50 MG tablet Take 1 tablet (50 mg total) by mouth daily. 90 tablet 3  . XARELTO 20 MG TABS tablet TAKE 1 TABLET BY MOUTH ONCE DAILY WITH  SUPPER 90 tablet 1   No current facility-administered medications for this visit.    Allergies:   Amoxicillin   Social History:  The patient  reports that he has never smoked. He has quit using smokeless tobacco.  His smokeless tobacco use included chew. He reports that he does not drink alcohol or use drugs.   Family History:  The  patient's family history includes Heart failure in his mother.   ROS:  Please see the history of present illness.   All other systems are personally reviewed and negative.    Exam:    Vital Signs:  BP 130/88   Pulse 72   Ht 6\' 2"  (1.88 m)   Wt 286 lb (129.7 kg)   SpO2 98%   BMI 36.72 kg/m   Well sounding and appearing, alert and conversant, regular work of breathing,  good skin  color Eyes- anicteric, neuro- grossly intact, skin- no apparent rash or lesions or cyanosis, mouth- oral mucosa is pink  Labs/Other Tests and Data Reviewed:    Recent Labs: No results found for requested labs within last 8760 hours.   Wt Readings from Last 3 Encounters:  10/21/19 286 lb (129.7 kg)  12/03/18 290 lb (131.5 kg)  10/06/18 297 lb (134.7 kg)      ASSESSMENT & PLAN:    1.  Persistent atrial fibrillation/ atypical atrial flutter He is pleased with current health state.  Feels that his afib is controlled off of AAD therapy chads2vasc score is 1.  He is on xarelto but would consider asa as an alternative  2. HTN Stable No change required today  3. Obesity Stable No change required today  Follow-up:  6 months in AF clinic   Patient Risk:  after full review of this patients clinical status, I feel that they are at moderate risk at this time.  Today, I have spent 15 minutes with the patient with telehealth technology discussing arrhythmia management .    Signed12/25/19, MD  10/21/2019 3:50 PM     [facility] [address] [place] [phone] (office) [phone] (fax)

## 2019-10-26 ENCOUNTER — Ambulatory Visit: Payer: BC Managed Care – PPO | Admitting: Internal Medicine

## 2020-01-06 ENCOUNTER — Telehealth: Payer: Self-pay | Admitting: Internal Medicine

## 2020-01-06 MED ORDER — LOSARTAN POTASSIUM 50 MG PO TABS: 50.0000 mg | ORAL_TABLET | Freq: Every day | ORAL | 3 refills | Status: DC

## 2020-01-06 NOTE — Telephone Encounter (Signed)
This is a A-Fib clinic pt 

## 2020-01-06 NOTE — Telephone Encounter (Signed)
*  STAT* If patient is at the pharmacy, call can be transferred to refill team.   1. Which medications need to be refilled? (please list name of each medication and dose if known)  losartan (COZAAR) 50 MG tablet  2. Which pharmacy/location (including street and city if local pharmacy) is medication to be sent to? [facility] [phone] - [place], [place] - [address]  3. Do they need a 30 day or 90 day supply? 90   Patient has not filled this rx at this pharmacy before

## 2020-03-09 IMAGING — DX DG CHEST 1V PORT
1 series · 1 of 1 positions shown · non-contrast
Comparison: 06/13/2016

CLINICAL DATA: Atrial fibrillation with tachycardia.

EXAM:
PORTABLE CHEST 1 VIEW

[chest ap]
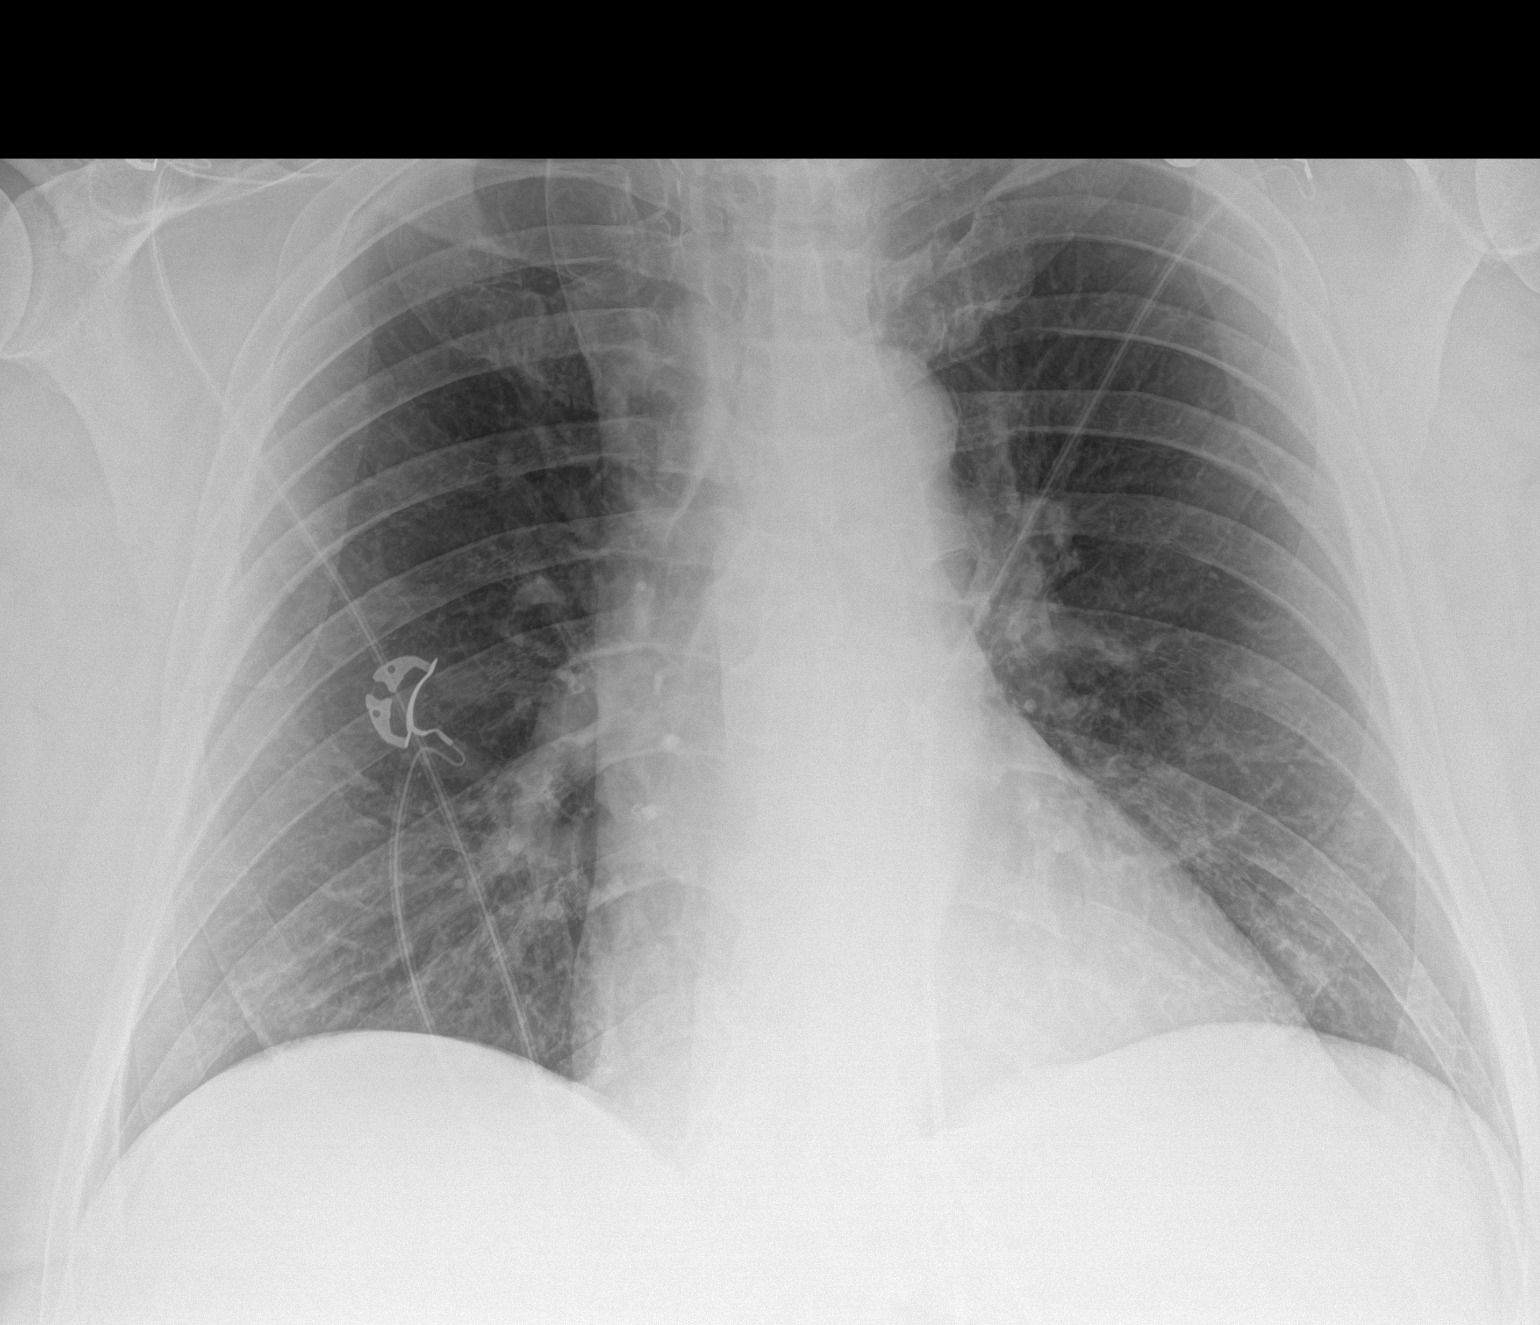

[1 of 1 positions shown; findings below may reference images not displayed]

FINDINGS: Artifact overlies the chest. Heart size upper limits of normal.
Mediastinal shadows are normal. The lungs are clear. The vascularity
is normal. No effusions. Incidental azygos fissure on the right.
IMPRESSION: No active disease.

## 2020-03-31 ENCOUNTER — Telehealth (HOSPITAL_COMMUNITY): Payer: Self-pay | Admitting: *Deleted

## 2020-03-31 NOTE — Telephone Encounter (Signed)
Cardiology records sent to [facility] in [place] [facility] fax # [phone]

## 2021-12-07 ENCOUNTER — Other Ambulatory Visit: Payer: Self-pay

## 2021-12-07 ENCOUNTER — Ambulatory Visit (HOSPITAL_BASED_OUTPATIENT_CLINIC_OR_DEPARTMENT_OTHER): Payer: Managed Care, Other (non HMO) | Admitting: Internal Medicine

## 2021-12-07 ENCOUNTER — Telehealth: Payer: Self-pay

## 2021-12-07 ENCOUNTER — Encounter (HOSPITAL_BASED_OUTPATIENT_CLINIC_OR_DEPARTMENT_OTHER): Payer: Self-pay | Admitting: Internal Medicine

## 2021-12-07 ENCOUNTER — Ambulatory Visit (HOSPITAL_BASED_OUTPATIENT_CLINIC_OR_DEPARTMENT_OTHER): Payer: BC Managed Care – PPO | Admitting: Internal Medicine

## 2021-12-07 VITALS — BP 138/86 | HR 72 | Ht 74.0 in | Wt 289.0 lb

## 2021-12-07 DIAGNOSIS — I4819 Other persistent atrial fibrillation: Secondary | ICD-10-CM

## 2021-12-07 DIAGNOSIS — E785 Hyperlipidemia, unspecified: Secondary | ICD-10-CM

## 2021-12-07 DIAGNOSIS — I10 Essential (primary) hypertension: Secondary | ICD-10-CM

## 2021-12-07 MED ORDER — LOSARTAN POTASSIUM 100 MG PO TABS: 100.0000 mg | ORAL_TABLET | Freq: Every day | ORAL | 3 refills | Status: DC

## 2021-12-07 MED ORDER — CARVEDILOL 25 MG PO TABS: ORAL_TABLET | ORAL | 3 refills | Status: DC

## 2021-12-07 NOTE — Patient Instructions (Addendum)
Medication Instructions:  Your physician recommends that you continue on your current medications as directed. Please refer to the Current Medication list given to you today.  AFIB CLINIC PHONE NUMBER:  [phone]  Labwork: You will get lab work today:  Lipid panel, CBC, BMP  Testing/Procedures: None ordered.  Follow-Up: Your physician wants you to follow-up in: one year with Dr. Johney Frame. You will receive a reminder letter in the mail two months in advance. If you don't receive a letter, please call our office to schedule the follow-up appointment.   Any Other Special Instructions Will Be Listed Below (If Applicable).  If you need a refill on your cardiac medications before your next appointment, please call your pharmacy.

## 2021-12-07 NOTE — Progress Notes (Signed)
PCP: Patient, No Pcp Per (Inactive)   Primary EP: Dr Rayann Heman  Russell Arnold is a 61 y.o. male who presents today for routine electrophysiology followup.  Since last being seen in our clinic, the patient reports doing very well.  He moved intermittently to [facility] since I saw him but has not returned to [place].  He reports having repeat AF ablation in 2021.  He has had no further symptoms of afib. Today, he denies symptoms of palpitations, chest pain, shortness of breath,  lower extremity edema, dizziness, presyncope, or syncope.  The patient is otherwise without complaint today.   Past Medical History:  Diagnosis Date   Acute diastolic heart failure ([place]) 05/2016   Family history of adverse reaction to anesthesia    Hypertension    Persistent atrial fibrillation [facility])    Past Surgical History:  Procedure Laterality Date   ABLATION OF DYSRHYTHMIC FOCUS  09/04/2016   APPENDECTOMY     ATRIAL FIBRILLATION ABLATION N/A 08/22/2017   Procedure: ATRIAL FIBRILLATION ABLATION;  Surgeon: Thompson Grayer, MD;  Location: [place] [facility] LAB;  Service: Cardiovascular;  Laterality: N/A;   CARDIOVERSION N/A 06/14/2016   Procedure: CARDIOVERSION;  Surgeon: Peter M Martinique, MD;  Location: [facility] ENDOSCOPY;  Service: Cardiovascular;  Laterality: N/A;   CARDIOVERSION N/A 06/17/2016   Procedure: CARDIOVERSION;  Surgeon: Jolaine Artist, MD;  Location: [place];  Service: Cardiovascular;  Laterality: N/A;   ELECTROPHYSIOLOGIC STUDY N/A 09/04/2016   Procedure: Atrial Fibrillation Ablation;  Surgeon: Thompson Grayer, MD;  Location: [place] CV LAB;  Service: Cardiovascular;  Laterality: N/A;   TEE WITHOUT CARDIOVERSION N/A 09/03/2016   Procedure: TRANSESOPHAGEAL ECHOCARDIOGRAM (TEE);  Surgeon: Sanda Klein, MD;  Location: [facility] ENDOSCOPY;  Service: Cardiovascular;  Laterality: N/A;   TEE WITHOUT CARDIOVERSION N/A 08/21/2017   Procedure: TRANSESOPHAGEAL ECHOCARDIOGRAM (TEE);  Surgeon: Josue Hector, MD;   Location: [facility] ENDOSCOPY;  Service: Cardiovascular;  Laterality: N/A;    ROS- all systems are reviewed and negatives except as per HPI above  Current Outpatient Medications  Medication Sig Dispense Refill   carvedilol (COREG) 25 MG tablet TAKE 1 TABLET BY MOUTH TWICE DAILY WITH A MEAL 60 tablet 6   cholecalciferol (VITAMIN D) 1000 units tablet Take 1,000 Units by mouth daily.     XARELTO 20 MG TABS tablet TAKE 1 TABLET BY MOUTH ONCE DAILY WITH  SUPPER 90 tablet 1   losartan (COZAAR) 100 MG tablet Take 100 mg by mouth daily.     No current facility-administered medications for this visit.    Physical Exam: Vitals:   12/07/21 0905  BP: 138/86  Pulse: 72  SpO2: 96%  Weight: 289 lb (131.1 kg)  Height: 6\' 2"  (1.88 m)    GEN- The patient is well appearing, alert and oriented x 3 today.   Head- normocephalic, atraumatic Eyes-  Sclera clear, conjunctiva pink Ears- hearing intact Oropharynx- clear Lungs- Clear to ausculation bilaterally, normal work of breathing Heart- Regular rate and rhythm, no murmurs, rubs or gallops, PMI not laterally displaced GI- soft, NT, ND, + BS Extremities- no clubbing, cyanosis, or edema  Wt Readings from Last 3 Encounters:  12/07/21 289 lb (131.1 kg)  10/21/19 286 lb (129.7 kg)  12/03/18 290 lb (131.5 kg)    EKG tracing ordered today is personally reviewed and shows sinus  Assessment and Plan:  Persistent atrial fibrillation/ atypical atrial flutter He has done well post ablation off AAD therapy Cbc and bmet today Chad2vasc score is 1.  2. HTN Stable No change required today Bmet today  3. Overweight Body mass index is 37.11 kg/m. We discussed lifestyle change Fasting lipids today  Return in a year   Thompson Grayer MD, [place] 12/07/2021 9:22 AM

## 2021-12-07 NOTE — Telephone Encounter (Signed)
Pt is requesting refills on his cardiac medications.  Refilled carvedilol and losartan.  Will send request for Xarelto refills to [facility] coumadin clinic.  Pt had lab work drawn today 12/07/2021.

## 2021-12-07 NOTE — Telephone Encounter (Signed)
Prescription refill request for Xarelto received.  Indication: Afib  Last office visit: 12/07/21 ([place])  Weight: 131.1kg Age: 61 Scr:  CrCl:   Pt had BMP drawn today. Will refill when labs are resulted

## 2021-12-08 LAB — LIPID PANEL
Chol/HDL Ratio: 2.7 ratio (ref 0.0–5.0)
Cholesterol, Total: 169 mg/dL (ref 100–199)
HDL: 62 mg/dL (ref >39)
LDL Chol Calc (NIH): 94 mg/dL (ref 0–99)
Triglycerides: 68 mg/dL (ref 0–149)
VLDL Cholesterol Cal: 13 mg/dL (ref 5–40)

## 2021-12-08 LAB — BASIC METABOLIC PANEL
BUN/Creatinine Ratio: 16 (ref 10–24)
BUN: 16 mg/dL (ref 8–27)
CO2: 20 mmol/L (ref 20–29)
Calcium: 9.9 mg/dL (ref 8.6–10.2)
Chloride: 102 mmol/L (ref 96–106)
Creatinine, Ser: 1.02 mg/dL (ref 0.76–1.27)
Glucose: 129 mg/dL — ABNORMAL HIGH (ref 70–99)
Potassium: 4.8 mmol/L (ref 3.5–5.2)
Sodium: 140 mmol/L (ref 134–144)
eGFR: 84 mL/min/{1.73_m2} (ref >59)

## 2021-12-08 LAB — CBC WITH DIFFERENTIAL/PLATELET
Basophils Absolute: 0.1 10*3/uL (ref 0.0–0.2)
Basos: 1 %
EOS (ABSOLUTE): 0.3 10*3/uL (ref 0.0–0.4)
Eos: 5 %
Hematocrit: 46.2 % (ref 37.5–51.0)
Hemoglobin: 16.6 g/dL (ref 13.0–17.7)
Immature Grans (Abs): 0 10*3/uL (ref 0.0–0.1)
Immature Granulocytes: 0 %
Lymphocytes Absolute: 1.4 10*3/uL (ref 0.7–3.1)
Lymphs: 24 %
MCH: 32.4 pg (ref 26.6–33.0)
MCHC: 35.9 g/dL — ABNORMAL HIGH (ref 31.5–35.7)
MCV: 90 fL (ref 79–97)
Monocytes Absolute: 0.6 10*3/uL (ref 0.1–0.9)
Monocytes: 9 %
Neutrophils Absolute: 3.7 10*3/uL (ref 1.4–7.0)
Neutrophils: 61 %
Platelets: 191 10*3/uL (ref 150–450)
RBC: 5.13 x10E6/uL (ref 4.14–5.80)
RDW: 11.8 % (ref 11.6–15.4)
WBC: 6 10*3/uL (ref 3.4–10.8)

## 2021-12-08 MED ORDER — RIVAROXABAN 20 MG PO TABS: 20.0000 mg | ORAL_TABLET | Freq: Every day | ORAL | 1 refills | Status: DC

## 2021-12-08 NOTE — Telephone Encounter (Signed)
Xarelto 20 mg refill request received. Pt is 61 years old, weight- 131.1 kg, Crea- 1.02 on 12/07/21, last seen by Dr. Rayann Heman on 12/07/21, Diagnosis- afib  CrCl- 142.81; Dose is appropriate based on dosing criteria. Will send in refill to requested pharmacy.

## 2021-12-27 DIAGNOSIS — R319 Hematuria, unspecified: Secondary | ICD-10-CM | POA: Diagnosis not present

## 2022-03-25 ENCOUNTER — Other Ambulatory Visit: Payer: Self-pay

## 2022-03-25 ENCOUNTER — Emergency Department (HOSPITAL_BASED_OUTPATIENT_CLINIC_OR_DEPARTMENT_OTHER): Payer: BC Managed Care – PPO

## 2022-03-25 ENCOUNTER — Emergency Department (HOSPITAL_BASED_OUTPATIENT_CLINIC_OR_DEPARTMENT_OTHER)
Admission: EM | Admit: 2022-03-25 | Discharge: 2022-03-25 | Disposition: A | Discharge status: Home / Self Care | Payer: BC Managed Care – PPO | Attending: Emergency Medicine | Admitting: Emergency Medicine

## 2022-03-25 ENCOUNTER — Emergency Department (HOSPITAL_BASED_OUTPATIENT_CLINIC_OR_DEPARTMENT_OTHER): Payer: BC Managed Care – PPO | Admitting: Radiology

## 2022-03-25 ENCOUNTER — Encounter (HOSPITAL_BASED_OUTPATIENT_CLINIC_OR_DEPARTMENT_OTHER): Payer: Self-pay | Admitting: Obstetrics and Gynecology

## 2022-03-25 DIAGNOSIS — Y9389 Activity, other specified: Secondary | ICD-10-CM | POA: Diagnosis not present

## 2022-03-25 DIAGNOSIS — Z7901 Long term (current) use of anticoagulants: Secondary | ICD-10-CM | POA: Diagnosis not present

## 2022-03-25 DIAGNOSIS — I110 Hypertensive heart disease with heart failure: Secondary | ICD-10-CM | POA: Diagnosis not present

## 2022-03-25 DIAGNOSIS — I4891 Unspecified atrial fibrillation: Secondary | ICD-10-CM | POA: Diagnosis not present

## 2022-03-25 DIAGNOSIS — S82122A Displaced fracture of lateral condyle of left tibia, initial encounter for closed fracture: Secondary | ICD-10-CM | POA: Diagnosis not present

## 2022-03-25 DIAGNOSIS — M25462 Effusion, left knee: Secondary | ICD-10-CM | POA: Diagnosis not present

## 2022-03-25 DIAGNOSIS — M25562 Pain in left knee: Secondary | ICD-10-CM | POA: Diagnosis not present

## 2022-03-25 DIAGNOSIS — S82142A Displaced bicondylar fracture of left tibia, initial encounter for closed fracture: Secondary | ICD-10-CM | POA: Diagnosis not present

## 2022-03-25 DIAGNOSIS — W1830XA Fall on same level, unspecified, initial encounter: Secondary | ICD-10-CM | POA: Diagnosis not present

## 2022-03-25 DIAGNOSIS — I509 Heart failure, unspecified: Secondary | ICD-10-CM | POA: Insufficient documentation

## 2022-03-25 DIAGNOSIS — S8992XA Unspecified injury of left lower leg, initial encounter: Secondary | ICD-10-CM | POA: Diagnosis not present

## 2022-03-25 DIAGNOSIS — M79662 Pain in left lower leg: Secondary | ICD-10-CM | POA: Diagnosis not present

## 2022-03-25 DIAGNOSIS — S82115A Nondisplaced fracture of left tibial spine, initial encounter for closed fracture: Secondary | ICD-10-CM | POA: Diagnosis not present

## 2022-03-25 MED ORDER — OXYCODONE-ACETAMINOPHEN 5-325 MG PO TABS: 1.0000 | ORAL_TABLET | Freq: Four times a day (QID) | ORAL | 0 refills | Status: AC | PRN

## 2022-03-25 MED ORDER — OXYCODONE-ACETAMINOPHEN 5-325 MG PO TABS
1.0000 | ORAL_TABLET | ORAL | Status: AC | PRN
  Administered 2022-03-25 (×2): 1 via ORAL
  Filled 2022-03-25 (×2): qty 1

## 2022-03-25 MED ORDER — OXYCODONE-ACETAMINOPHEN 5-325 MG PO TABS: 1.0000 | ORAL_TABLET | Freq: Four times a day (QID) | ORAL | 0 refills | Status: DC | PRN

## 2022-03-25 NOTE — ED Triage Notes (Signed)
Patient reports to the ER for left knee injury. Patient was trying to get out of the golf cart to get the dog and the gold cart was still moving. Patient felt his left knee go the wrong direction. Patient reports he cannot walk on the knee.

## 2022-03-25 NOTE — Discharge Instructions (Addendum)
Pain medication has been sent to your pharmacy by the name of Percocet.  You may take 1 tablet every 6 hours as needed for severe pain.  You may also continue utilize RICE therapy for additional relief.  You have been provided with a knee immobilizer and crutches in the ED today.  Please continue to remain completely nonweightbearing until you are able to follow-up with orthopedics.  Contact information for local orthopedic specialist by the name of Dr. Sammuel Hines has been provided for you.  Please call first thing in the morning to schedule an appointment 2 weeks from now for reevaluation and continued medical management.  Return to the ED for new or worsening symptoms as discussed.

## 2022-03-25 NOTE — ED Notes (Signed)
Discharge paperwork given and understood. 

## 2022-03-25 NOTE — ED Provider Notes (Signed)
[facility] EMERGENCY DEPT Provider Note   CSN: [id number] Arrival date & time: 03/25/22  1508     History  Chief Complaint  Patient presents with   Knee Pain   Leg Pain    Russell Arnold is a 61 y.o. male with chief complaint of acute left knee pain.  Patient in a golf cart and went to step out to get a dog, but the golf cart was still moving.  When he stepped out he felt his knee "go in the wrong direction".  Difficulty bearing weight on that leg.  No known prior history of surgery or injury to that leg.  Denies numbness or tingling of the leg.  No recent fever or infection.  On Xarelto chronically.  Hx of atrial fibrillation, CHF, HTN, cardiomyopathy.  The history is provided by the patient and medical records.  Knee Pain Leg Pain      Home Medications Prior to Admission medications   Medication Sig Start Date End Date Taking? Authorizing Provider  oxyCODONE-acetaminophen (PERCOCET/ROXICET) 5-325 MG tablet Take 1 tablet by mouth every 6 (six) hours as needed for up to 5 days for severe pain. 03/25/22 03/30/22 Yes Prince Rome, PA-C  carvedilol (COREG) 25 MG tablet TAKE 1 TABLET BY MOUTH TWICE DAILY WITH A MEAL 12/07/21   Allred, Jeneen Rinks, MD  cholecalciferol (VITAMIN D) 1000 units tablet Take 1,000 Units by mouth daily.    [provider]  losartan (COZAAR) 100 MG tablet Take 1 tablet (100 mg total) by mouth daily. 12/07/21   Allred, Jeneen Rinks, MD  rivaroxaban (XARELTO) 20 MG TABS tablet Take 1 tablet (20 mg total) by mouth daily with supper. 12/08/21   Allred, Jeneen Rinks, MD      Allergies    Amoxicillin    Review of Systems   Review of Systems  Musculoskeletal:        Left knee pain    Physical Exam Updated Vital Signs BP 124/81 (BP Location: Right Arm)   Pulse 71   Temp 98.6 F (37 C)   Resp 16   Ht 6\' 2"  (1.88 m)   Wt 129.3 kg   SpO2 97%   BMI 36.59 kg/m  Physical Exam Vitals and nursing note reviewed.  Constitutional:       General: He is not in acute distress.    Appearance: Normal appearance. He is well-developed. He is not ill-appearing or diaphoretic.  HENT:     Head: Normocephalic and atraumatic.  Eyes:     Conjunctiva/sclera: Conjunctivae normal.  Cardiovascular:     Rate and Rhythm: Normal rate. Rhythm irregular.     Pulses: Normal pulses.  Pulmonary:     Effort: Pulmonary effort is normal. No respiratory distress.     Breath sounds: Normal breath sounds.  Abdominal:     Palpations: Abdomen is soft.     Tenderness: There is no abdominal tenderness.  Musculoskeletal:        General: Swelling, tenderness and signs of injury present.     Cervical back: Neck supple.     Right lower leg: No edema.     Left lower leg: No edema.  Skin:    General: Skin is warm and dry.     Capillary Refill: Capillary refill takes less than 2 seconds.  Neurological:     Mental Status: He is alert and oriented to person, place, and time.  Psychiatric:        Mood and Affect: Mood normal.     ED  Results / Procedures / Treatments   Labs (all labs ordered are listed, but only abnormal results are displayed) Labs Reviewed - No data to display  EKG None  Radiology CLINICAL DATA:  Knee injury    EXAM:  LEFT KNEE - COMPLETE 4+ VIEW    COMPARISON:  None Available.    FINDINGS:  No fracture or dislocation of the left knee. Joint spaces are  preserved. Large knee joint effusion. Soft tissue edema anteriorly.    IMPRESSION:  1.  No fracture or dislocation of the left knee.    2.  Large knee joint effusion. Soft tissue edema anteriorly.      Electronically Signed    By: Jearld Lesch M.D.    On: 03/25/2022 17:56    CLINICAL DATA:  Trauma, pain    EXAM:  LEFT TIBIA AND FIBULA - 2 VIEW    COMPARISON:  None    FINDINGS:  In the AP view of upper portion of left lower leg there is thin  linear lucency in the proximal shaft of tibia. Small smooth  marginated calcification adjacent to the medial  malleolus may be  residual from previous injury. There is calcification at the  attachment of Achilles tendon to the calcaneus suggesting calcific  tendinosis. Minimal bony spurs seen in the dorsal aspect of  talonavicular joint. There is possible tiny plantar spur in the  calcaneus.    IMPRESSION:  There is thin linear radiolucency in the lateral aspect of proximal  shaft of left tibia. Findings suggest undisplaced fracture.      Electronically Signed    By: Ernie Avena M.D.    On: 03/25/2022 18:39     CLINICAL DATA:  Trauma abnormal radiograph    EXAM:  CT OF THE LEFT KNEE WITHOUT CONTRAST    TECHNIQUE:  Multidetector CT imaging of the left knee was performed according to  the standard protocol. Multiplanar CT image reconstructions were  also generated.    RADIATION DOSE REDUCTION: This exam was performed according to the  departmental dose-optimization program which includes automated  exposure control, adjustment of the mA and/or kV according to  patient size and/or use of iterative reconstruction technique.    COMPARISON:  None Available.    FINDINGS:  Bones/Joint/Cartilage    There is a vertical fracture through the lateral tibial plateau.  Fracture involves the lateral tibial spine. There is mild depression  of the lateral tibial plateau with mild comminution posteriorly.  Vertebral fracture plane extends several cm inferiorly.    Ligaments    Suboptimally assessed by CT.    Muscles and Tendons    Un remark    Soft tissues    suprapatellar joint effusion.    IMPRESSION:  Vertical fracture through the lateral tibial plateau with depression  of the articular surface and mild comminution posteriorly.      Electronically Signed    By: Genevive Bi M.D.    On: 03/25/2022 20:21      Procedures Procedures    Medications Ordered in ED Medications  oxyCODONE-acetaminophen (PERCOCET/ROXICET) 5-325 MG per tablet 1 tablet (1 tablet Oral  Given 03/25/22 2039)    ED Course/ Medical Decision Making/ A&P                           Medical Decision Making Amount and/or Complexity of Data Reviewed External Data Reviewed: notes. Labs: ordered. Decision-making details documented in ED Course. Radiology: ordered and independent interpretation  performed. Decision-making details documented in ED Course. ECG/medicine tests: ordered and independent interpretation performed. Decision-making details documented in ED Course.  Risk OTC drugs. Prescription drug management.   61 y.o. male presents to the ED for concern of Knee Pain and Leg Pain   This involves an extensive number of treatment options, and is a complaint that carries with it a high risk of complications and morbidity.  The emergent differential diagnosis prior to evaluation includes, but is not limited to: Contusion, dislocation, fracture  This is not an exhaustive differential.   Past Medical History / Co-morbidities / Social History: Hx of A-fib, essential HTN, CHF, chronic anticoagulation on Xarelto.  Prior appendectomy.  Additional History:  Internal and external records from outside source obtained and reviewed including cardiology  Physical Exam: Physical exam performed. The pertinent findings include: Tenderness and swelling of the left knee  Lab Tests: None  Imaging Studies: I ordered imaging studies including the left knee and left tib-fib.  I independently visualized and interpreted said imaging.  Pertinent results include: XR left knee: No acute bony pathology or dislocation; evidence of moderate to large effusion XR left tib-fib: Possible fracture of the tibia CT left knee: Vertical fracture through the lateral tibial plateau with depression of the articular surface and mild comminution posteriorly. I agree with the radiologist interpretation.  Medications: I have reviewed the patients home medicines and have made adjustments as needed  ED  Course/Disposition: Pt well-appearing on exam.  Presenting with left knee and left shin pain following an acute injury.  Mechanism of injury via a twisting motion, pt felt the knee "go the wrong direction" and give way.  On chronic Xarelto, NSAIDs contraindicated.  No evidence of ischemia, compartment syndrome, DVT, malalignment, dislocation, obvious deformity on exam.  Moderate effusion appreciated.  Patient with difficulty bearing weight.  Lower extremities remain neurovascularly intact.  X-Rays suggestive for possible tibial plateau fracture.  Plan to further assess with CT imaging of the left knee.  2020: CT imaging of the left knee indicates vertical fracture through the lateral tibial plateau with depression of the articular surface and mild comminution posteriorly.    2100: I consulted with Dr. Sammuel Hines of Orthopedic Surgery, and discussed lab and imaging findings as well as pertinent plan - they recommend: Since his nondisplaced, does not believe he needs surgery at this time.  Provide knee immobilizer and crutches, and have patient nonweightbearing for the next 2 weeks.  At that time he will follow-up with orthopedics for reevaluation.    2115: Pain managed in ED.  Thoroughly discussed plan treatment course and orthopedic recommendations with the patient and his significant other.  Pt given knee immobilizer and crutches while in ED and instructed to be nonweightbearing until seen by Ortho in 2 weeks.  Short course of pain therapy sent to pharmacy.  Recommended utilization of rice therapy and conservative treatment measures as well.  Patient in NAD and good condition at time of discharge.  After consideration of the diagnostic results and the patient's encounter today, I feel that the emergency department workup does not suggest an emergent condition requiring admission or immediate intervention beyond what has been performed at this time.  The patient is safe for discharge and has been instructed to  return immediately for worsening symptoms, change in symptoms or any other concerns.  Discussed course of treatment thoroughly with the patient, whom demonstrated understanding.  Patient in agreement and has no further questions.  I discussed this case with my attending physician  Dr. Maryan Rued, who agreed with the proposed treatment course and cosigned this note including patient's presenting symptoms, physical exam, and planned diagnostics and interventions.  Attending physician stated agreement with plan or made changes to plan which were implemented.     This chart was dictated using voice recognition software.  Despite best efforts to proofread, errors can occur which can change the documentation meaning.         Final Clinical Impression(s) / ED Diagnoses Final diagnoses:  Acute pain of left knee  Knee effusion, left  Pain in left lower leg  Tibial plateau fracture, left, closed, initial encounter  Chronic anticoagulation-Xarelto    Rx / DC Orders ED Discharge Orders          Ordered    oxyCODONE-acetaminophen (PERCOCET/ROXICET) 5-325 MG tablet  Every 6 hours PRN        03/25/22 1826              Prince Rome, PA-C [id number]    Blanchie Dessert, MD 03/29/22 1145

## 2022-03-27 DIAGNOSIS — S82142A Displaced bicondylar fracture of left tibia, initial encounter for closed fracture: Secondary | ICD-10-CM

## 2022-04-11 ENCOUNTER — Inpatient Hospital Stay (HOSPITAL_BASED_OUTPATIENT_CLINIC_OR_DEPARTMENT_OTHER): Payer: BC Managed Care – PPO | Admitting: Orthopaedic Surgery

## 2022-04-20 ENCOUNTER — Ambulatory Visit (HOSPITAL_BASED_OUTPATIENT_CLINIC_OR_DEPARTMENT_OTHER): Payer: BC Managed Care – PPO | Admitting: Orthopaedic Surgery

## 2022-04-20 ENCOUNTER — Ambulatory Visit (INDEPENDENT_AMBULATORY_CARE_PROVIDER_SITE_OTHER): Payer: BC Managed Care – PPO

## 2022-04-20 DIAGNOSIS — S82142A Displaced bicondylar fracture of left tibia, initial encounter for closed fracture: Secondary | ICD-10-CM

## 2022-04-20 DIAGNOSIS — S82102D Unspecified fracture of upper end of left tibia, subsequent encounter for closed fracture with routine healing: Secondary | ICD-10-CM | POA: Diagnosis not present

## 2022-04-20 NOTE — Progress Notes (Signed)
Chief Complaint: Left knee racture     History of Present Illness:    Russell Arnold is a 61 y.o. male presents with left tibial plateau fracture after a fall out of a golf cart while chasing after his daughter's dog.  He went to the emergency room and was made nonweightbearing in the knee immobilizer.  [place] using crut[place] to keep weight off of the leg.  He endorses compliance with weightbearing.  Here today for further assessment    Surgical History:   None  PMH/PSH/Family History/Social History/Meds/Allergies:    Past Medical History:  Diagnosis Date   Acute diastolic heart failure (HCC) 05/2016   Family history of adverse reaction to anesthesia    Hypertension    Persistent atrial fibrillation [facility])    Past Surgical History:  Procedure Laterality Date   ABLATION OF DYSRHYTHMIC FOCUS  09/04/2016   APPENDECTOMY     ATRIAL FIBRILLATION ABLATION N/A 08/22/2017   Procedure: ATRIAL FIBRILLATION ABLATION;  Surgeon: Hillis Range, MD;  Location: [facility] INVASIVE CV LAB;  Service: Cardiovascular;  Laterality: N/A;   CARDIOVERSION N/A 06/14/2016   Procedure: CARDIOVERSION;  Surgeon: Peter M Swaziland, MD;  Location: [facility] ENDOSCOPY;  Service: Cardiovascular;  Laterality: N/A;   CARDIOVERSION N/A 06/17/2016   Procedure: CARDIOVERSION;  Surgeon: Dolores Patty, MD;  Location: [facility] OR;  Service: Cardiovascular;  Laterality: N/A;   ELECTROPHYSIOLOGIC STUDY N/A 09/04/2016   Procedure: Atrial Fibrillation Ablation;  Surgeon: Hillis Range, MD;  Location: [facility] INVASIVE CV LAB;  Service: Cardiovascular;  Laterality: N/A;   TEE WITHOUT CARDIOVERSION N/A 09/03/2016   Procedure: TRANSESOPHAGEAL ECHOCARDIOGRAM (TEE);  Surgeon: Thurmon Fair, MD;  Location: [facility] ENDOSCOPY;  Service: Cardiovascular;  Laterality: N/A;   TEE WITHOUT CARDIOVERSION N/A 08/21/2017   Procedure: TRANSESOPHAGEAL ECHOCARDIOGRAM (TEE);  Surgeon: Wendall Stade, MD;   Location: [facility] ENDOSCOPY;  Service: Cardiovascular;  Laterality: N/A;   Social History   Socioeconomic History   Marital status: Married    Spouse name: Not on file   Number of children: Not on file   Years of education: Not on file   Highest education level: Not on file  Occupational History   Occupation: Company secretary  Tobacco Use   Smoking status: Never    Passive exposure: Past   Smokeless tobacco: Former    Types: Associate Professor Use: Never used  Substance and Sexual Activity   Alcohol use: Yes    Comment: social   Drug use: No   Sexual activity: Yes  Other Topics Concern   Not on file  Social History Narrative   Not on file   Social Determinants of Health   Financial Resource Strain: Not on file  Food Insecurity: Not on file  Transportation Needs: Not on file  Physical Activity: Not on file  Stress: Not on file  Social Connections: Not on file   Family History  Problem Relation Age of Onset   Heart failure Mother    Allergies  Allergen Reactions   Amoxicillin Hives    Hives - noted "red splotches" - also ineffective per pt Has patient had a PCN reaction causing immediate rash, facial/tongue/throat swelling, SOB or lightheadedness with hypotension: No Has patient had a PCN reaction causing severe rash involving mucus membranes or skin  necrosis: No Has patient had a PCN reaction that required hospitalization:No Has patient had a PCN reaction occurring within the last 10 years:Yes If all of the above answers are "NO", then may proceed with Cephalosporin use.     Current Outpatient Medications  Medication Sig Dispense Refill   carvedilol (COREG) 25 MG tablet TAKE 1 TABLET BY MOUTH TWICE DAILY WITH A MEAL 180 tablet 3   cholecalciferol (VITAMIN D) 1000 units tablet Take 1,000 Units by mouth daily.     losartan (COZAAR) 100 MG tablet Take 1 tablet (100 mg total) by mouth daily. 90 tablet 3   rivaroxaban (XARELTO) 20 MG TABS tablet Take 1 tablet (20  mg total) by mouth daily with supper. 90 tablet 1   No current facility-administered medications for this visit.   No results found.  Review of Systems:   A ROS was performed including pertinent positives and negatives as documented in the HPI.  Physical Exam :   Constitutional: NAD and appears stated age Neurological: Alert and oriented Psych: Appropriate affect and cooperative There were no vitals taken for this visit.   Comprehensive Musculoskeletal Exam:    Tenderness palpation about lateral joint line.  There is a effusion involving the knee.  He is able to fire his dorsiflexors of the left great toe.  Fires EHL.  He is endorsing some numbness over the dorsal aspect of the left foot.  Toes are warm well perfused with 2+ dorsalis pedis pulse.  Compartments are soft and compressible  Imaging:   Xray (x-ray 4 views left knee): Minimally displaced tibial plateau fracture partial articular laterally   I personally reviewed and interpreted the radiographs.   Assessment:   61 y.o. male with a lateral partial articular tibial plateau fracture without significant joint depression.  To this effect I do believe that at this time we can progress him to approximately hip 50% weightbearing on the left leg.  Following this he may advance to 100% weightbearing.  I will see him back in 3 weeks for recheck.  He will begin PT at this time.  I provided him a hinged knee brace.  Plan :    -Clinic in 3 weeks     I personally saw and evaluated the patient, and participated in the management and treatment plan.  Huel Cote, MD Attending Physician, Orthopedic Surgery  This document was dictated using Dragon voice recognition software. A reasonable attempt at proof reading has been made to minimize errors.

## 2022-05-03 ENCOUNTER — Ambulatory Visit (HOSPITAL_BASED_OUTPATIENT_CLINIC_OR_DEPARTMENT_OTHER): Payer: Self-pay | Admitting: Physical Therapy

## 2022-05-11 ENCOUNTER — Ambulatory Visit (HOSPITAL_BASED_OUTPATIENT_CLINIC_OR_DEPARTMENT_OTHER): Payer: BC Managed Care – PPO | Admitting: Orthopaedic Surgery

## 2022-05-30 ENCOUNTER — Ambulatory Visit (HOSPITAL_BASED_OUTPATIENT_CLINIC_OR_DEPARTMENT_OTHER): Payer: BC Managed Care – PPO | Admitting: Orthopaedic Surgery

## 2022-08-15 ENCOUNTER — Other Ambulatory Visit: Payer: Self-pay | Admitting: Internal Medicine

## 2022-08-15 DIAGNOSIS — I4891 Unspecified atrial fibrillation: Secondary | ICD-10-CM

## 2022-08-15 NOTE — Telephone Encounter (Signed)
Prescription refill request for Xarelto received.  Indication: Afib  Last office visit: 12/07/21 (Allred)  Weight: 129.3kg Age: 61 Scr: 1.02 (12/07/21) CrCl: 140.33ml/min  Appropriate dose and refill sent to requested pharmacy.

## 2022-10-01 DIAGNOSIS — R0602 Shortness of breath: Secondary | ICD-10-CM | POA: Diagnosis not present

## 2022-10-01 DIAGNOSIS — R059 Cough, unspecified: Secondary | ICD-10-CM | POA: Diagnosis not present

## 2022-10-10 DIAGNOSIS — I2510 Atherosclerotic heart disease of native coronary artery without angina pectoris: Secondary | ICD-10-CM | POA: Diagnosis not present

## 2022-10-10 DIAGNOSIS — J9801 Acute bronchospasm: Secondary | ICD-10-CM | POA: Diagnosis not present

## 2022-10-10 DIAGNOSIS — Z79899 Other long term (current) drug therapy: Secondary | ICD-10-CM | POA: Diagnosis not present

## 2022-10-10 DIAGNOSIS — I4891 Unspecified atrial fibrillation: Secondary | ICD-10-CM | POA: Diagnosis not present

## 2022-10-10 DIAGNOSIS — Z880 Allergy status to penicillin: Secondary | ICD-10-CM | POA: Diagnosis not present

## 2022-10-10 DIAGNOSIS — I509 Heart failure, unspecified: Secondary | ICD-10-CM | POA: Diagnosis not present

## 2022-10-10 DIAGNOSIS — I110 Hypertensive heart disease with heart failure: Secondary | ICD-10-CM | POA: Diagnosis not present

## 2022-10-10 DIAGNOSIS — R0602 Shortness of breath: Secondary | ICD-10-CM | POA: Diagnosis not present

## 2022-10-10 DIAGNOSIS — Z9049 Acquired absence of other specified parts of digestive tract: Secondary | ICD-10-CM | POA: Diagnosis not present

## 2022-10-10 DIAGNOSIS — Z7901 Long term (current) use of anticoagulants: Secondary | ICD-10-CM | POA: Diagnosis not present

## 2022-11-28 ENCOUNTER — Other Ambulatory Visit: Payer: Self-pay

## 2022-11-28 MED ORDER — LOSARTAN POTASSIUM 100 MG PO TABS: 100.0000 mg | ORAL_TABLET | Freq: Every day | ORAL | 0 refills | Status: DC

## 2022-12-03 ENCOUNTER — Other Ambulatory Visit: Payer: Self-pay

## 2022-12-03 MED ORDER — CARVEDILOL 25 MG PO TABS: ORAL_TABLET | ORAL | 0 refills | Status: DC

## 2022-12-17 ENCOUNTER — Other Ambulatory Visit: Payer: Self-pay | Admitting: *Deleted

## 2022-12-17 DIAGNOSIS — I4891 Unspecified atrial fibrillation: Secondary | ICD-10-CM

## 2022-12-17 MED ORDER — RIVAROXABAN 20 MG PO TABS: 20.0000 mg | ORAL_TABLET | Freq: Every day | ORAL | 0 refills | Status: AC

## 2022-12-17 NOTE — Telephone Encounter (Signed)
Xarelto '20mg'$  refill request received. Pt is 63 years old, weight-129.3kg, Crea-0.87 on 10/10/22 via [place] from [place], last seen by Dr. Rayann Heman on 12/07/21 & pending appt on 01/29/23 with Dr. Myles Gip, Diagnosis-Afib, CrCl-163.07 mL/min; Dose is appropriate based on dosing criteria. Will send in refill to requested pharmacy.

## 2022-12-29 ENCOUNTER — Other Ambulatory Visit: Payer: Self-pay | Admitting: Student

## 2022-12-29 ENCOUNTER — Other Ambulatory Visit: Payer: Self-pay | Admitting: Internal Medicine

## 2023-01-29 ENCOUNTER — Ambulatory Visit: Payer: No Typology Code available for payment source | Admitting: Cardiovascular Disease

## 2023-01-30 ENCOUNTER — Other Ambulatory Visit: Payer: Self-pay | Admitting: Internal Medicine

## 2023-02-24 ENCOUNTER — Other Ambulatory Visit: Payer: Self-pay | Admitting: Cardiovascular Disease

## 2023-04-25 ENCOUNTER — Other Ambulatory Visit: Payer: Self-pay | Admitting: Cardiovascular Disease

## 2023-04-25 DIAGNOSIS — I4891 Unspecified atrial fibrillation: Secondary | ICD-10-CM

## 2023-04-25 NOTE — Telephone Encounter (Signed)
Per scheduler: Russell Arnold sent to Satira Sark, RN Patient states that he has moved to [facility] and did not want to schedule.  Will refuse Xarelto refill at local [place] [facility].

## 2023-04-25 NOTE — Telephone Encounter (Signed)
Pt last saw Dr Johney Frame 12/07/21, pt is overdue for follow-up.  Recall in Epic, pt had new pt appt scheduled with Dr Nelly Laurence for 01/29/23 that was cancelled due to insurance issues.  Pt needs follow-up appt for rx refills.  Msg sent to schedulers requesting f/u appt be made.  Last labs 02/05/23 Creat 1.19, age 62, weight 129.3kg, CrCl 119.22 based on CrCl pt ias on appropriate dosage of Xarelto 20mg  every day for afib.  Will refill rx once follow-up appt made to get pt to [facility].  Will await follow-up appt to be made.

## 2023-09-14 IMAGING — DX DG TIBIA/FIBULA 2V*L*
4 series · 4 of 4 positions shown · non-contrast
Comparison: None

CLINICAL DATA: Trauma, pain

EXAM:
LEFT TIBIA AND FIBULA - 2 VIEW

[tibia ap (1 of 2)]
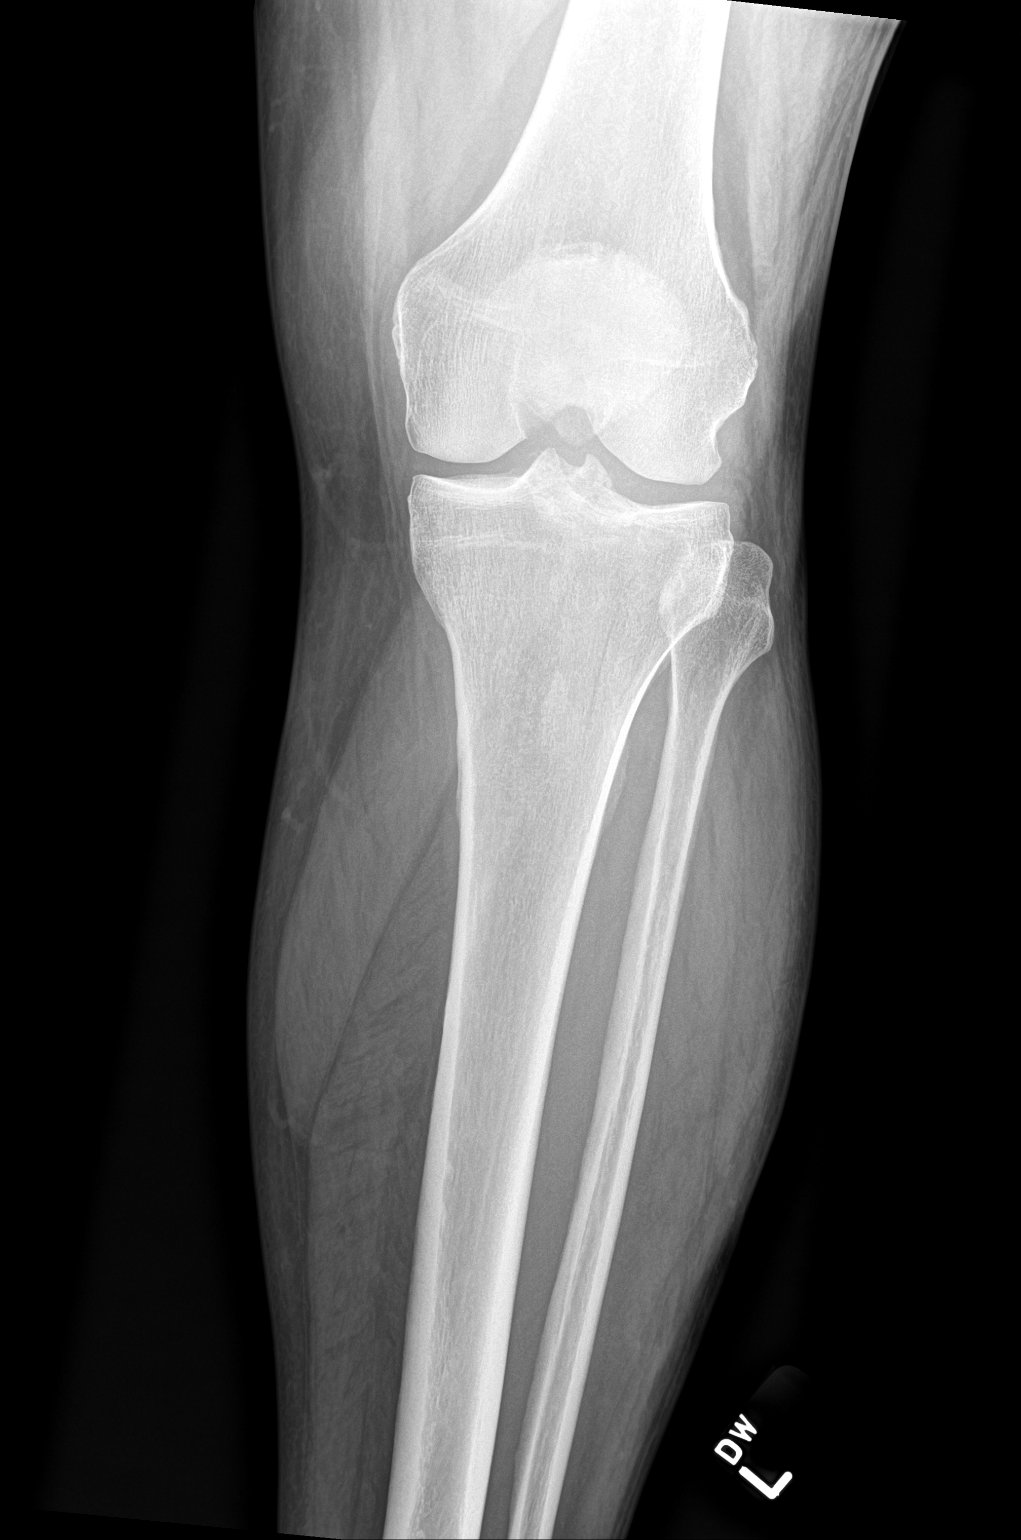

[tibia ap (2 of 2)]
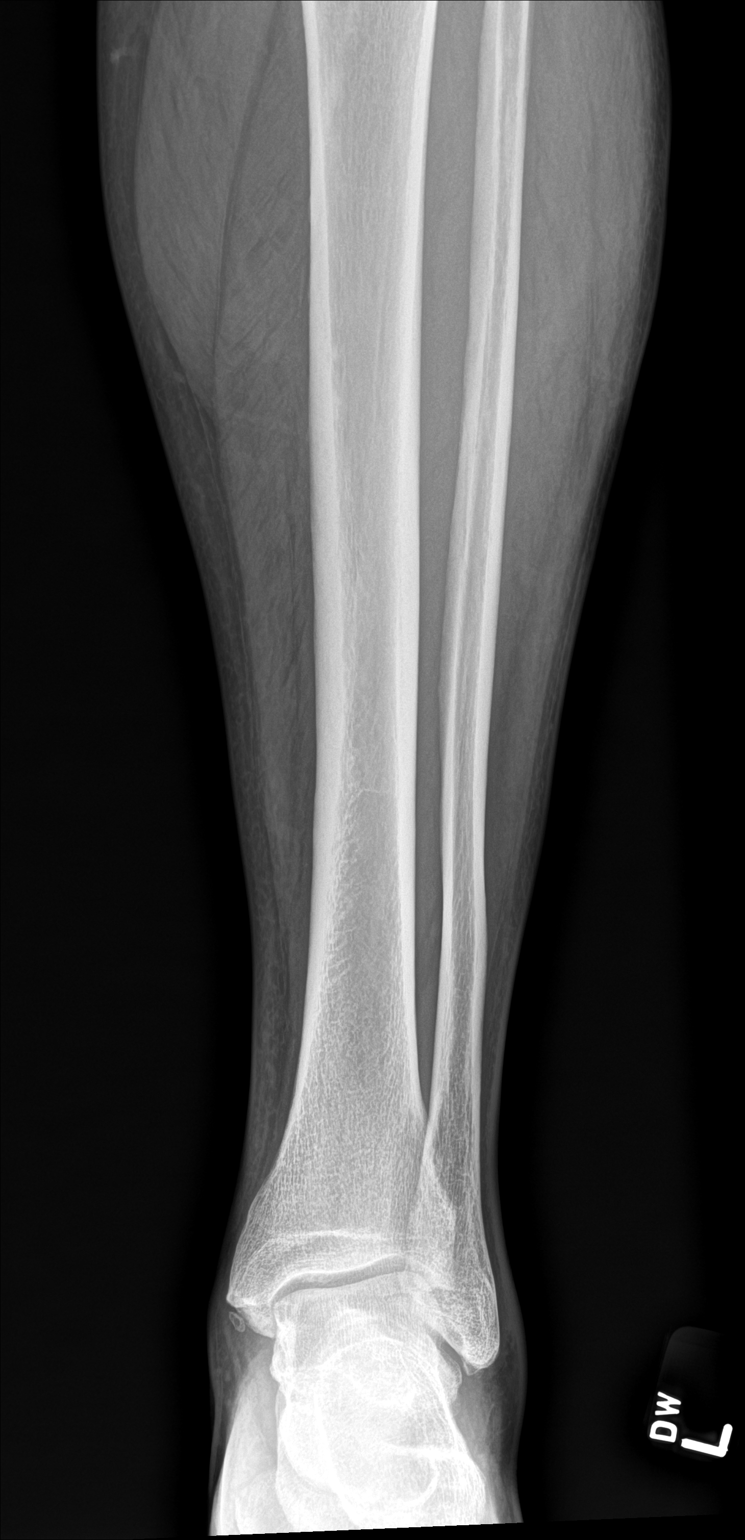

[tibia lat (1 of 2)]
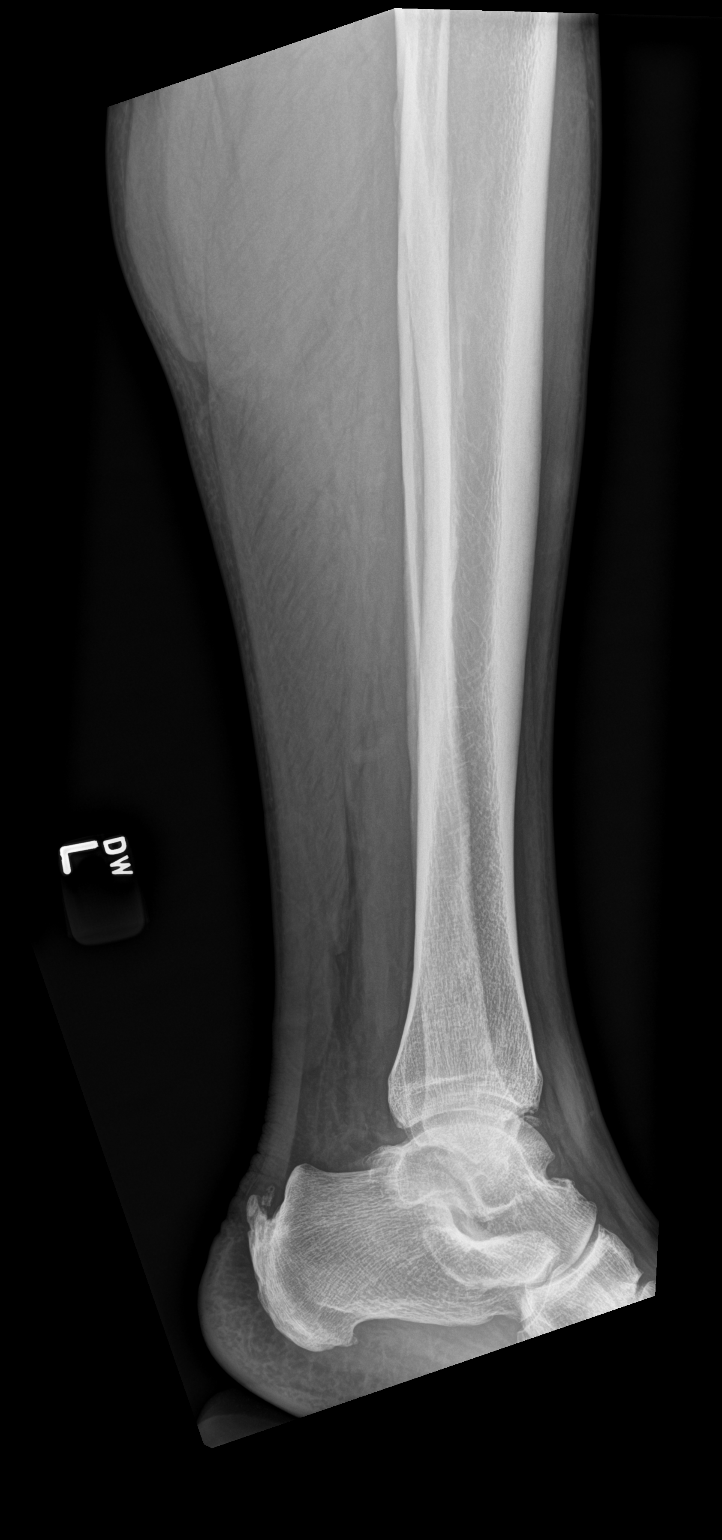

[tibia lat (2 of 2)]
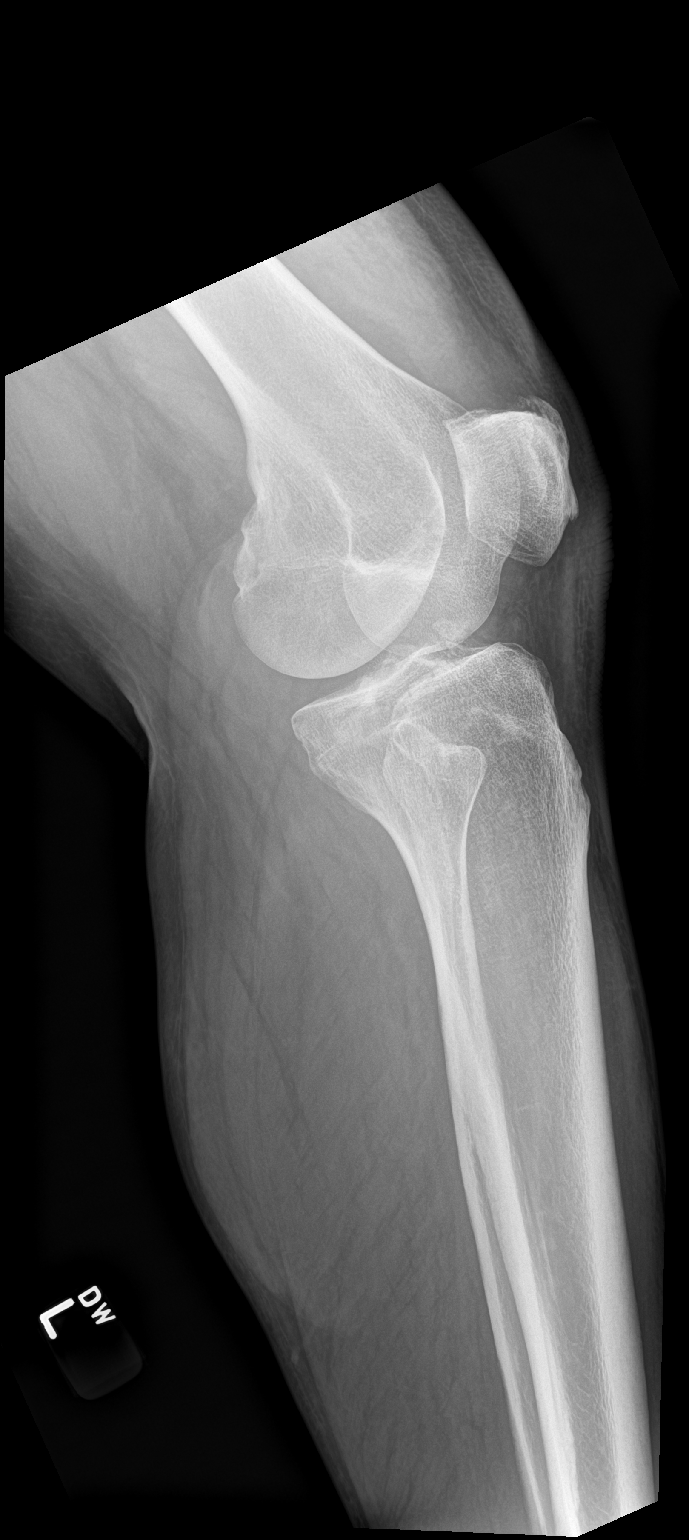

[4 of 4 positions shown; findings below may reference images not displayed]

FINDINGS: In the AP view of upper portion of left lower leg there is thin
linear lucency in the proximal shaft of tibia. Small smooth
marginated calcification adjacent to the medial malleolus may be
residual from previous injury. There is calcification at the
attachment of Achilles tendon to the calcaneus suggesting calcific
tendinosis. Minimal bony spurs seen in the dorsal aspect of
talonavicular joint. There is possible tiny plantar spur in the
calcaneus.
IMPRESSION: There is thin linear radiolucency in the lateral aspect of proximal
shaft of left tibia. Findings suggest undisplaced fracture.

## 2023-09-14 IMAGING — DX DG KNEE COMPLETE 4+V*L*
4 series · 4 of 4 positions shown · non-contrast
Comparison: None Available.

CLINICAL DATA: Knee injury

EXAM:
LEFT KNEE - COMPLETE 4+ VIEW

[knee ap]
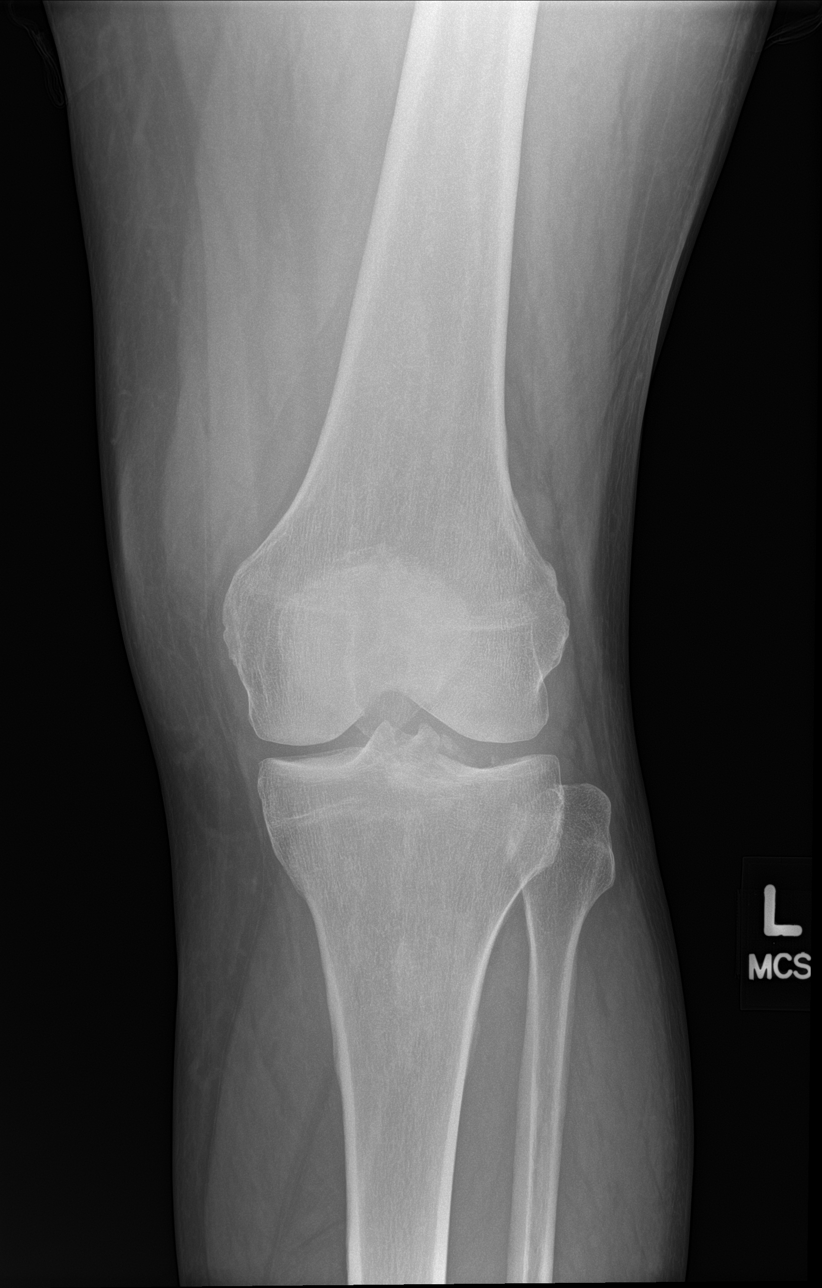

[knee lat]
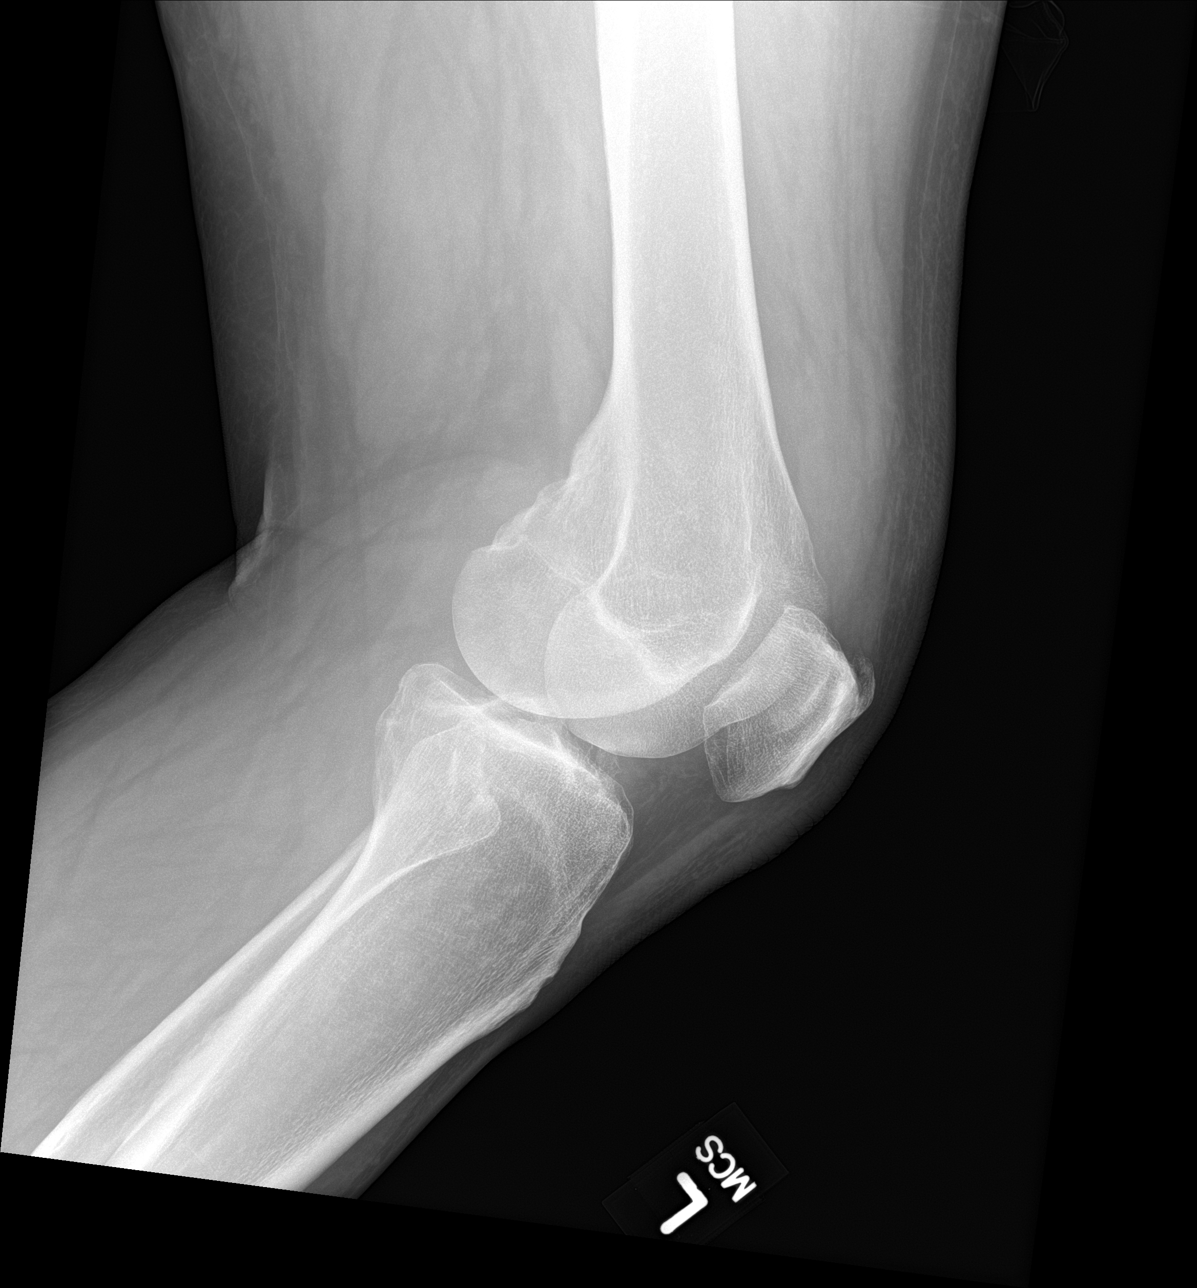

[knee obl (1 of 2)]
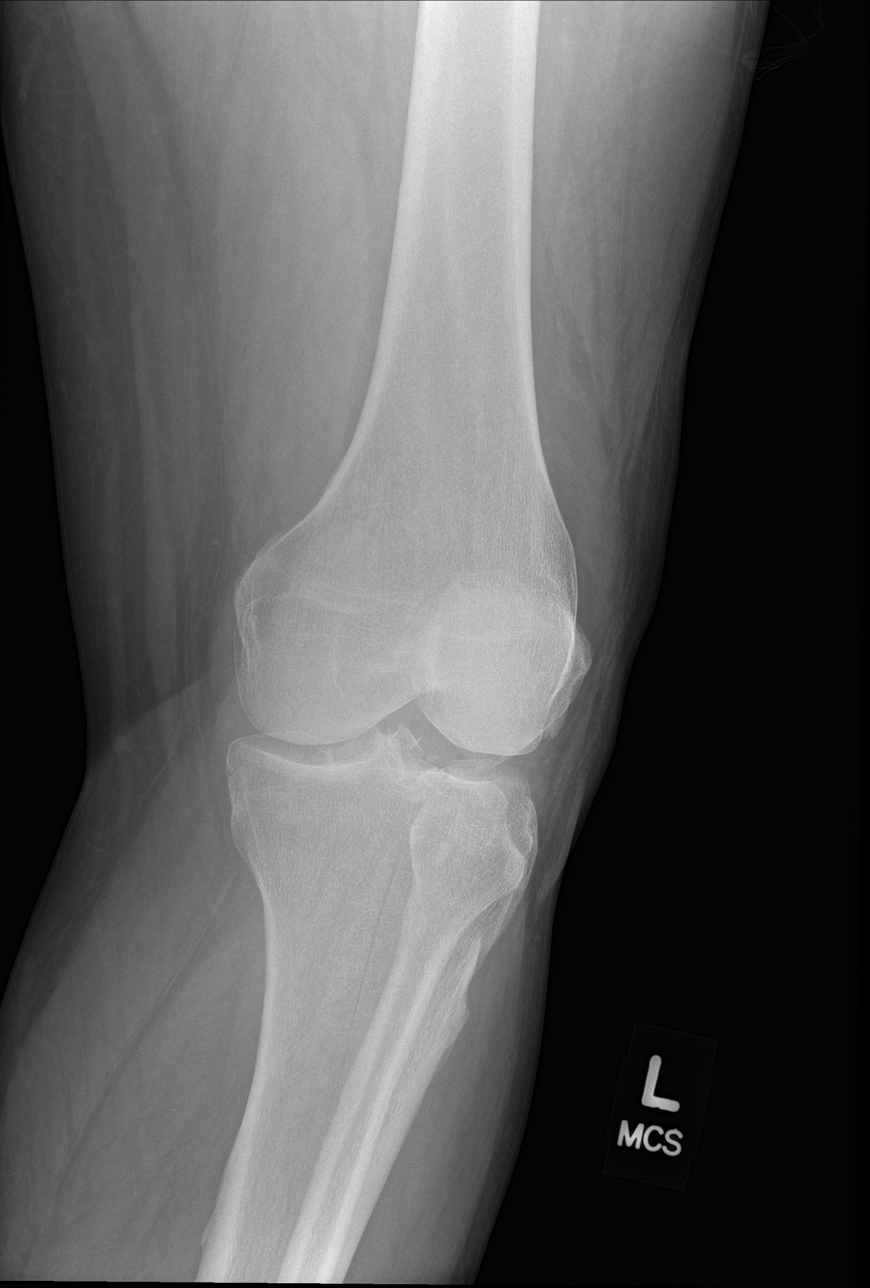

[knee obl (2 of 2)]
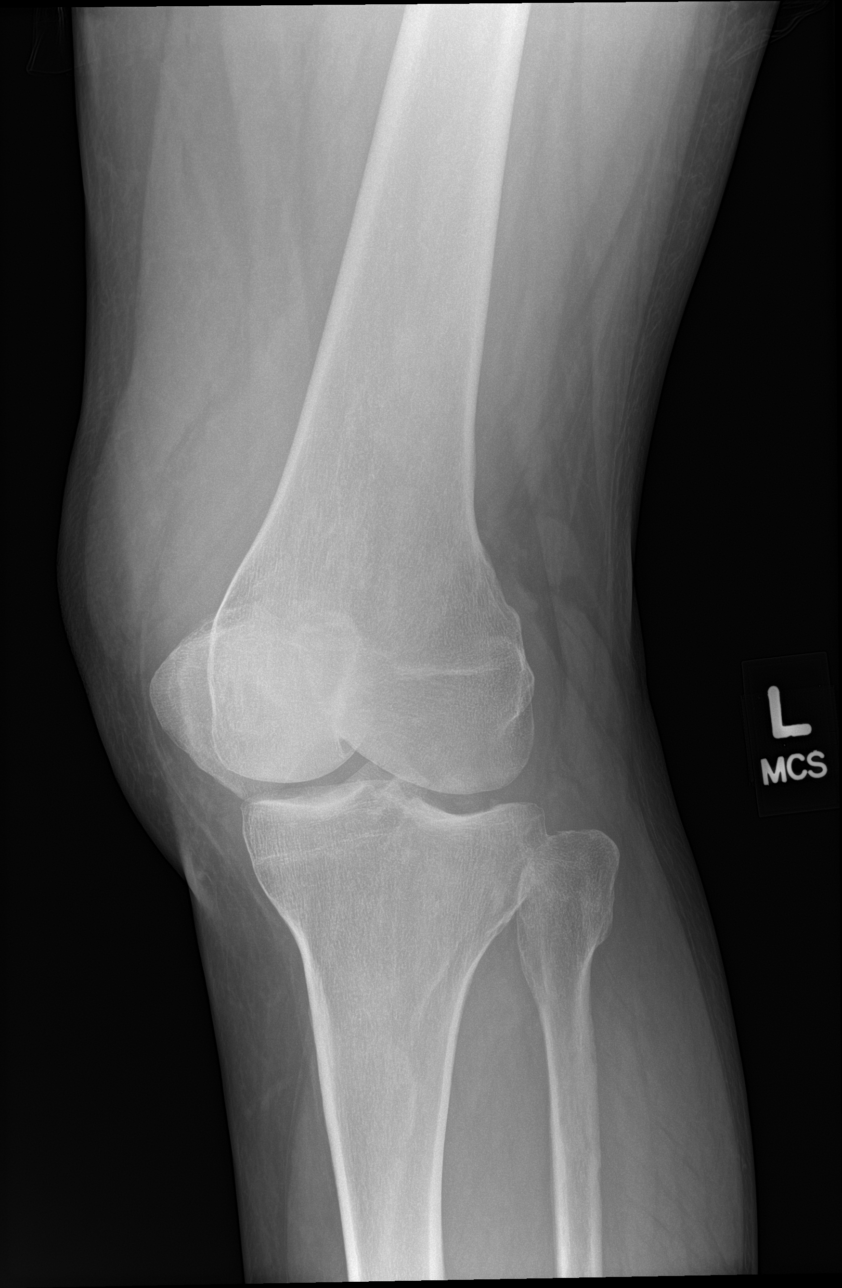

[4 of 4 positions shown; findings below may reference images not displayed]

FINDINGS: No fracture or dislocation of the left knee. Joint spaces are
preserved. Large knee joint effusion. Soft tissue edema anteriorly.
IMPRESSION: 1.  No fracture or dislocation of the left knee.

2.  Large knee joint effusion. Soft tissue edema anteriorly.
